# Patient Record
Sex: Female | Born: 1970 | Race: Black or African American | Hispanic: No | Marital: Single | Smoking: Never smoker
Health system: Southern US, Community
[De-identification: ages and names within clinical notes are randomized; demographics above are authoritative.]

## PROBLEM LIST (undated history)

## (undated) DIAGNOSIS — T7840XA Allergy, unspecified, initial encounter: Secondary | ICD-10-CM

## (undated) DIAGNOSIS — K219 Gastro-esophageal reflux disease without esophagitis: Secondary | ICD-10-CM

## (undated) DIAGNOSIS — M8700 Idiopathic aseptic necrosis of unspecified bone: Secondary | ICD-10-CM

## (undated) HISTORY — DX: Allergy, unspecified, initial encounter: T78.40XA

## (undated) HISTORY — DX: Idiopathic aseptic necrosis of unspecified bone: M87.00

## (undated) HISTORY — PX: UPPER GASTROINTESTINAL ENDOSCOPY: SHX188

---

## 2003-03-16 HISTORY — PX: TUBAL LIGATION: SHX77

## 2015-04-29 DIAGNOSIS — G8929 Other chronic pain: Secondary | ICD-10-CM | POA: Diagnosis not present

## 2015-04-29 DIAGNOSIS — M5441 Lumbago with sciatica, right side: Secondary | ICD-10-CM | POA: Diagnosis not present

## 2015-04-29 MED FILL — OMEPRAZOLE DR 20 MG CAPSULE: 20 | 90 days supply | Qty: 90 | Fill #0

## 2015-04-30 MED FILL — AMOXICILLIN 875 MG TABLET: 875 | 7 days supply | Qty: 14 | Fill #0

## 2015-05-15 DIAGNOSIS — L219 Seborrheic dermatitis, unspecified: Secondary | ICD-10-CM | POA: Diagnosis not present

## 2015-05-15 DIAGNOSIS — L700 Acne vulgaris: Secondary | ICD-10-CM | POA: Diagnosis not present

## 2015-05-15 MED FILL — KETOCONAZOLE 2% CREAM: 2 | 10 days supply | Qty: 30 | Fill #0

## 2015-05-15 MED FILL — FINACEA 15% FOAM: 15 | 30 days supply | Qty: 50 | Fill #0

## 2015-07-31 MED FILL — OMEPRAZOLE DR 20 MG CAPSULE: 20 | 90 days supply | Qty: 90 | Fill #1

## 2015-10-06 DIAGNOSIS — R922 Inconclusive mammogram: Secondary | ICD-10-CM | POA: Diagnosis not present

## 2015-10-06 DIAGNOSIS — Z1231 Encounter for screening mammogram for malignant neoplasm of breast: Secondary | ICD-10-CM | POA: Diagnosis not present

## 2015-10-09 DIAGNOSIS — N6489 Other specified disorders of breast: Secondary | ICD-10-CM | POA: Diagnosis not present

## 2015-10-09 DIAGNOSIS — R928 Other abnormal and inconclusive findings on diagnostic imaging of breast: Secondary | ICD-10-CM | POA: Diagnosis not present

## 2015-10-28 MED FILL — OMEPRAZOLE DR 20 MG CAPSULE: 20 | 90 days supply | Qty: 90 | Fill #2

## 2015-12-22 DIAGNOSIS — Z01419 Encounter for gynecological examination (general) (routine) without abnormal findings: Secondary | ICD-10-CM | POA: Diagnosis not present

## 2015-12-26 DIAGNOSIS — Z01419 Encounter for gynecological examination (general) (routine) without abnormal findings: Secondary | ICD-10-CM | POA: Diagnosis not present

## 2015-12-26 DIAGNOSIS — Z6827 Body mass index (BMI) 27.0-27.9, adult: Secondary | ICD-10-CM | POA: Diagnosis not present

## 2016-01-28 MED FILL — OMEPRAZOLE DR 20 MG CAPSULE: 20 | 90 days supply | Qty: 90 | Fill #0

## 2016-03-16 MED FILL — FLUCONAZOLE 150 MG TABLET: 150 | 1 days supply | Qty: 1 | Fill #0

## 2016-04-26 DIAGNOSIS — Z5181 Encounter for therapeutic drug level monitoring: Secondary | ICD-10-CM | POA: Diagnosis not present

## 2016-04-26 DIAGNOSIS — Z79899 Other long term (current) drug therapy: Secondary | ICD-10-CM | POA: Diagnosis not present

## 2016-04-26 DIAGNOSIS — K219 Gastro-esophageal reflux disease without esophagitis: Secondary | ICD-10-CM | POA: Diagnosis not present

## 2016-04-26 MED FILL — OMEPRAZOLE DR 20 MG CAPSULE: 20 | 90 days supply | Qty: 90 | Fill #0

## 2016-05-12 MED FILL — FLUCONAZOLE 150 MG TABLET: 150 | 1 days supply | Qty: 1 | Fill #1

## 2016-06-21 DIAGNOSIS — R928 Other abnormal and inconclusive findings on diagnostic imaging of breast: Secondary | ICD-10-CM | POA: Diagnosis not present

## 2016-06-21 DIAGNOSIS — R922 Inconclusive mammogram: Secondary | ICD-10-CM | POA: Diagnosis not present

## 2016-07-26 MED FILL — OMEPRAZOLE DR 20 MG CAPSULE: 20 | 90 days supply | Qty: 90 | Fill #1

## 2016-09-13 MED FILL — FLUCONAZOLE 150 MG TABLET: 150 | 1 days supply | Qty: 1 | Fill #2

## 2016-10-20 MED FILL — OMEPRAZOLE DR 20 MG CAPSULE: 20 | 90 days supply | Qty: 90 | Fill #2

## 2016-10-22 MED FILL — FLUCONAZOLE 150 MG TABLET: 150 | 1 days supply | Qty: 1 | Fill #3

## 2016-12-16 MED FILL — FLUCONAZOLE 150 MG TABLET: 150 | 1 days supply | Qty: 1 | Fill #4

## 2017-01-18 MED FILL — OMEPRAZOLE 20 MG CAP: 20 | 90 days supply | Qty: 90 | Fill #3

## 2017-02-16 DIAGNOSIS — Z01419 Encounter for gynecological examination (general) (routine) without abnormal findings: Secondary | ICD-10-CM | POA: Diagnosis not present

## 2017-02-16 DIAGNOSIS — Z1211 Encounter for screening for malignant neoplasm of colon: Secondary | ICD-10-CM | POA: Diagnosis not present

## 2017-02-16 DIAGNOSIS — R7309 Other abnormal glucose: Secondary | ICD-10-CM | POA: Diagnosis not present

## 2017-03-11 MED FILL — FLUCONAZOLE 150 MG TABLET: 150 | 3 days supply | Qty: 2 | Fill #0

## 2017-03-26 ENCOUNTER — Emergency Department (HOSPITAL_BASED_OUTPATIENT_CLINIC_OR_DEPARTMENT_OTHER)
Admission: EM | Admit: 2017-03-26 | Discharge: 2017-03-26 | Disposition: A | Discharge status: Home / Self Care | Payer: 59 | Attending: Emergency Medicine | Admitting: Emergency Medicine

## 2017-03-26 ENCOUNTER — Other Ambulatory Visit: Payer: Self-pay

## 2017-03-26 ENCOUNTER — Encounter (HOSPITAL_BASED_OUTPATIENT_CLINIC_OR_DEPARTMENT_OTHER): Payer: Self-pay | Admitting: Emergency Medicine

## 2017-03-26 DIAGNOSIS — H9203 Otalgia, bilateral: Secondary | ICD-10-CM

## 2017-03-26 DIAGNOSIS — H6992 Unspecified Eustachian tube disorder, left ear: Secondary | ICD-10-CM | POA: Diagnosis not present

## 2017-03-26 DIAGNOSIS — Z79899 Other long term (current) drug therapy: Secondary | ICD-10-CM | POA: Diagnosis not present

## 2017-03-26 DIAGNOSIS — R6883 Chills (without fever): Secondary | ICD-10-CM | POA: Diagnosis not present

## 2017-03-26 DIAGNOSIS — H6983 Other specified disorders of Eustachian tube, bilateral: Secondary | ICD-10-CM | POA: Insufficient documentation

## 2017-03-26 DIAGNOSIS — R0981 Nasal congestion: Secondary | ICD-10-CM | POA: Diagnosis not present

## 2017-03-26 DIAGNOSIS — H6991 Unspecified Eustachian tube disorder, right ear: Secondary | ICD-10-CM | POA: Diagnosis not present

## 2017-03-26 DIAGNOSIS — H9202 Otalgia, left ear: Secondary | ICD-10-CM | POA: Diagnosis not present

## 2017-03-26 DIAGNOSIS — H9201 Otalgia, right ear: Secondary | ICD-10-CM | POA: Diagnosis not present

## 2017-03-26 HISTORY — DX: Gastro-esophageal reflux disease without esophagitis: K21.9

## 2017-03-26 MED ORDER — PSEUDOEPHEDRINE HCL 60 MG PO TABS: 60.0000 mg | ORAL_TABLET | ORAL | 0 refills | Status: DC | PRN

## 2017-03-26 MED ORDER — FLUTICASONE PROPIONATE 50 MCG/ACT NA SUSP: 2.0000 | Freq: Every day | NASAL | 0 refills | Status: DC

## 2017-03-26 NOTE — ED Provider Notes (Signed)
[facility] EMERGENCY DEPARTMENT Provider Note   CSN: [id number] Arrival date & time: 03/26/17  1301     History   Chief Complaint Chief Complaint  Patient presents with  . Otalgia    HPI Janet May is a 47 y.o. female.  Patient with recent history of URI symptoms, now improved, presents with bilateral ear pressure continuing.  She has had chills but no documented fevers.  No facial pain but has had some mild congestion.  She is using over-the-counter cough and cold medications without relief.  Pain in the left ear is worse than the right.  No vomiting, cough or chest pain.  Onset of symptoms acute.  Course is constant.       Past Medical History:  Diagnosis Date  . GERD (gastroesophageal reflux disease)     There are no active problems to display for this patient.   History reviewed. No pertinent surgical history.  OB History    No data available       Home Medications    Prior to Admission medications   Medication Sig Start Date End Date Taking? Authorizing Provider  omeprazole (PRILOSEC) 20 MG capsule Take 20 mg by mouth daily.   Yes [provider]  fluticasone (FLONASE) 50 MCG/ACT nasal spray Place 2 sprays into both nostrils daily. 03/26/17   Carlisle Cater, PA-C  pseudoephedrine (SUDAFED) 60 MG tablet Take 1 tablet (60 mg total) by mouth every 4 (four) hours as needed for congestion. 03/26/17   Carlisle Cater, PA-C    Family History History reviewed. No pertinent family history.  Social History Social History   Tobacco Use  . Smoking status: Never Smoker  . Smokeless tobacco: Never Used  Substance Use Topics  . Alcohol use: No    Frequency: Never  . Drug use: No     Allergies   Sulfa antibiotics   Review of Systems Review of Systems  Constitutional: Positive for chills. Negative for fatigue and fever.  HENT: Positive for congestion and ear pain. Negative for rhinorrhea, sinus pressure and sore throat.   Eyes:  Negative for redness.  Respiratory: Negative for cough and wheezing.   Gastrointestinal: Negative for abdominal pain, diarrhea, nausea and vomiting.  Genitourinary: Negative for dysuria.  Musculoskeletal: Negative for myalgias and neck stiffness.  Skin: Negative for rash.  Neurological: Negative for headaches.  Hematological: Negative for adenopathy.     Physical Exam Updated Vital Signs BP (!) 145/90 (BP Location: Left Arm)   Pulse 76   Temp 98.2 F (36.8 C) (Oral)   Resp 18   Ht 5\' 5"  (1.651 m)   Wt 74.8 kg (165 lb)   LMP 03/12/2017   SpO2 100%   BMI 27.46 kg/m   Physical Exam  Constitutional: She appears well-developed and well-nourished.  HENT:  Head: Normocephalic and atraumatic.  Right Ear: External ear and ear canal normal. Tympanic membrane is not retracted and not bulging.  Left Ear: External ear and ear canal normal. Tympanic membrane is bulging. Tympanic membrane is not retracted.  Nose: Nose normal. No mucosal edema or rhinorrhea.  Mouth/Throat: Uvula is midline, oropharynx is clear and moist and mucous membranes are normal. Mucous membranes are not dry. No oral lesions. No trismus in the jaw. No uvula swelling. No oropharyngeal exudate, posterior oropharyngeal edema, posterior oropharyngeal erythema or tonsillar abscesses.  Eyes: Conjunctivae are normal. Right eye exhibits no discharge. Left eye exhibits no discharge.  Neck: Normal range of motion. Neck supple.  Cardiovascular: Normal rate,  regular rhythm and normal heart sounds.  Pulmonary/Chest: Effort normal and breath sounds normal. No respiratory distress. She has no wheezes. She has no rales.  Abdominal: Soft. There is no tenderness.  Lymphadenopathy:    She has no cervical adenopathy.  Neurological: She is alert.  Skin: Skin is warm and dry.  Psychiatric: She has a normal mood and affect.  Nursing note and vitals reviewed.    ED Treatments / Results   Procedures Procedures (including critical care  time)  Medications Ordered in ED Medications - No data to display   Initial Impression / Assessment and Plan / ED Course  I have reviewed the triage vital signs and the nursing notes.  Pertinent labs & imaging results that were available during my care of the patient were reviewed by me and considered in my medical decision making (see chart for details).     Patient seen and examined.  Discussed conservative measures for eustachian tube dysfunction and expected course.  Vital signs reviewed and are as follows: BP (!) 145/90 (BP Location: Left Arm)   Pulse 76   Temp 98.2 F (36.8 C) (Oral)   Resp 18   Ht 5\' 5"  (1.651 m)   Wt 74.8 kg (165 lb)   LMP 03/12/2017   SpO2 100%   BMI 27.46 kg/m   Encouraged follow-up PCP if not improved in 1 week.  Patient urged to return with worsening symptoms or other concerns. Patient verbalized understanding and agrees with plan.    Final Clinical Impressions(s) / ED Diagnoses   Final diagnoses:  Dysfunction of both eustachian tubes  Otalgia of both ears   Bilateral ear pain, left greater than right, after recent UTI.  Patient does have some bulging without infection of her left eardrum.  Symptoms and history consistent with eustachian tube dysfunction.  Treatment as above.  Discussed expectant management.  ED Discharge Orders        Ordered    pseudoephedrine (SUDAFED) 60 MG tablet  Every 4 hours PRN     03/26/17 1456    fluticasone (FLONASE) 50 MCG/ACT nasal spray  Daily     03/26/17 1456       Carlisle Cater, PA-C 03/26/17 1507    Drenda Freeze, MD 03/26/17 [phone]

## 2017-03-26 NOTE — ED Triage Notes (Signed)
Patient states that she has had cough, and congestion since New years. The patient reports that now both ears hurt L > R

## 2017-03-26 NOTE — Discharge Instructions (Signed)
Please read and follow all provided instructions.  Your diagnoses today include:  1. Dysfunction of both eustachian tubes   2. Otalgia of both ears    Tests performed today include:  Vital signs. See below for your results today.   Medications prescribed:   Pseudoephedrine - decongestant medication to help with nasal congestion   Flonase - nasal spray steroid  Take any prescribed medications only as directed. Treatment for your infection is aimed at treating the symptoms. There are no medications, such as antibiotics, that will cure your infection.   Home care instructions:  Follow any educational materials contained in this packet.   Please continue drinking plenty of fluids.  Use over-the-counter medicines as needed as directed on packaging for symptom relief.  You may also use ibuprofen or tylenol as directed on packaging for pain or fever.  Do not take multiple medicines containing Tylenol or acetaminophen to avoid taking too much of this medication.  Follow-up instructions: Please follow-up with your primary care provider in the next 7 days for further evaluation of your symptoms if you are not feeling better.   Return instructions:   Please return to the Emergency Department if you experience worsening symptoms.   RETURN IMMEDIATELY IF you develop shortness of breath, confusion or altered mental status, a new rash, become dizzy, faint, or poorly responsive, or are unable to be cared for at home.  Please return if you have persistent vomiting and cannot keep down fluids or develop a fever that is not controlled by tylenol or motrin.    Please return if you have any other emergent concerns.  Additional Information:  Your vital signs today were: BP (!) 145/90 (BP Location: Left Arm)    Pulse 76    Temp 98.2 F (36.8 C) (Oral)    Resp 18    Ht 5\' 5"  (1.651 m)    Wt 74.8 kg (165 lb)    LMP 03/12/2017    SpO2 100%    BMI 27.46 kg/m  If your blood pressure (BP) was elevated  above 135/85 this visit, please have this repeated by your doctor within one month. --------------

## 2017-04-12 DIAGNOSIS — Z1231 Encounter for screening mammogram for malignant neoplasm of breast: Secondary | ICD-10-CM | POA: Diagnosis not present

## 2017-04-19 MED FILL — OMEPRAZOLE 20 MG CAP: 20 | 90 days supply | Qty: 90 | Fill #0

## 2017-04-26 MED FILL — FLUCONAZOLE 150 MG TABS: 150 | 3 days supply | Qty: 2 | Fill #1

## 2017-05-20 DIAGNOSIS — Z79899 Other long term (current) drug therapy: Secondary | ICD-10-CM | POA: Diagnosis not present

## 2017-05-20 DIAGNOSIS — K219 Gastro-esophageal reflux disease without esophagitis: Secondary | ICD-10-CM | POA: Diagnosis not present

## 2017-05-20 DIAGNOSIS — Z5181 Encounter for therapeutic drug level monitoring: Secondary | ICD-10-CM | POA: Diagnosis not present

## 2017-06-17 MED FILL — FLUCONAZOLE 150 MG TABS: 150 | 3 days supply | Qty: 2 | Fill #2

## 2017-07-08 MED FILL — OMEPRAZOLE 20 MG CAP: 20 | 90 days supply | Qty: 90 | Fill #0

## 2017-08-29 MED FILL — FLUCONAZOLE 150 MG TABS: 150 | 3 days supply | Qty: 2 | Fill #3

## 2017-09-28 DIAGNOSIS — M25551 Pain in right hip: Secondary | ICD-10-CM | POA: Diagnosis not present

## 2017-09-28 DIAGNOSIS — G8929 Other chronic pain: Secondary | ICD-10-CM | POA: Diagnosis not present

## 2017-10-04 MED FILL — OMEPRAZOLE 20 MG CPDR: 20 | 90 days supply | Qty: 90 | Fill #1

## 2018-01-02 MED FILL — OMEPRAZOLE 20 MG CPDR: 20 | 90 days supply | Qty: 90 | Fill #2

## 2018-01-18 ENCOUNTER — Ambulatory Visit (INDEPENDENT_AMBULATORY_CARE_PROVIDER_SITE_OTHER): Payer: 59

## 2018-01-18 ENCOUNTER — Encounter (INDEPENDENT_AMBULATORY_CARE_PROVIDER_SITE_OTHER): Payer: Self-pay | Admitting: Surgery

## 2018-01-18 ENCOUNTER — Ambulatory Visit (INDEPENDENT_AMBULATORY_CARE_PROVIDER_SITE_OTHER): Payer: 59 | Admitting: Surgery

## 2018-01-18 DIAGNOSIS — M25551 Pain in right hip: Secondary | ICD-10-CM | POA: Diagnosis not present
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
## 2018-01-19 ENCOUNTER — Other Ambulatory Visit (INDEPENDENT_AMBULATORY_CARE_PROVIDER_SITE_OTHER): Payer: Self-pay | Admitting: Surgery

## 2018-01-19 ENCOUNTER — Telehealth (INDEPENDENT_AMBULATORY_CARE_PROVIDER_SITE_OTHER): Payer: Self-pay

## 2018-01-19 ENCOUNTER — Ambulatory Visit (INDEPENDENT_AMBULATORY_CARE_PROVIDER_SITE_OTHER): Payer: 59

## 2018-01-19 DIAGNOSIS — M25551 Pain in right hip: Secondary | ICD-10-CM | POA: Diagnosis not present

## 2018-01-19 NOTE — Telephone Encounter (Signed)
Patient unable to get labs drawn at [facility], advised patient to go to [place] location to have labs drawn at the lab there.

## 2018-01-19 NOTE — Addendum Note (Signed)
Addended by: Brand Males E on: 01/19/2018 10:43 AM   Modules accepted: Orders

## 2018-01-19 NOTE — Addendum Note (Signed)
Addended byLaurann Montana on: 01/19/2018 10:47 AM   Modules accepted: Orders

## 2018-01-21 LAB — CBC WITH DIFFERENTIAL/PLATELET
BASOS ABS: 12 {cells}/uL (ref 0–200)
Basophils Relative: 0.2 %
Eosinophils Absolute: 19 cells/uL (ref 15–500)
Eosinophils Relative: 0.3 %
HEMATOCRIT: 43 % (ref 35.0–45.0)
HEMOGLOBIN: 14.2 g/dL (ref 11.7–15.5)
LYMPHS ABS: 1823 {cells}/uL (ref 850–3900)
MCH: 29.5 pg (ref 27.0–33.0)
MCHC: 33 g/dL (ref 32.0–36.0)
MCV: 89.4 fL (ref 80.0–100.0)
MPV: 10.6 fL (ref 7.5–12.5)
Monocytes Relative: 8.2 %
NEUTROS PCT: 61.9 %
Neutro Abs: 3838 cells/uL (ref 1500–7800)
Platelets: 336 10*3/uL (ref 140–400)
RBC: 4.81 10*6/uL (ref 3.80–5.10)
RDW: 12.4 % (ref 11.0–15.0)
Total Lymphocyte: 29.4 %
WBC: 6.2 10*3/uL (ref 3.8–10.8)
WBCMIX: 508 {cells}/uL (ref 200–950)

## 2018-01-21 LAB — ANA: Anti Nuclear Antibody(ANA): NEGATIVE

## 2018-01-21 LAB — URIC ACID: URIC ACID, SERUM: 6.4 mg/dL (ref 2.5–7.0)

## 2018-01-21 LAB — RHEUMATOID FACTOR: Rhuematoid fact SerPl-aCnc: <14 IU/mL (ref <14)

## 2018-01-21 LAB — SEDIMENTATION RATE: Sed Rate: 17 mm/h (ref 0–20)

## 2018-01-27 ENCOUNTER — Ambulatory Visit
Admission: RE | Admit: 2018-01-27 | Discharge: 2018-01-27 | Disposition: A | Discharge status: Home / Self Care | Payer: 59 | Source: Ambulatory Visit | Attending: Surgery | Admitting: Surgery

## 2018-01-27 DIAGNOSIS — M25551 Pain in right hip: Secondary | ICD-10-CM

## 2018-01-27 DIAGNOSIS — M87051 Idiopathic aseptic necrosis of right femur: Secondary | ICD-10-CM | POA: Diagnosis not present

## 2018-02-07 ENCOUNTER — Ambulatory Visit (INDEPENDENT_AMBULATORY_CARE_PROVIDER_SITE_OTHER): Payer: 59 | Admitting: Orthopaedic Surgery

## 2018-02-08 ENCOUNTER — Encounter (INDEPENDENT_AMBULATORY_CARE_PROVIDER_SITE_OTHER): Payer: Self-pay | Admitting: Surgery

## 2018-02-08 ENCOUNTER — Ambulatory Visit (INDEPENDENT_AMBULATORY_CARE_PROVIDER_SITE_OTHER): Payer: 59 | Admitting: Surgery

## 2018-02-08 DIAGNOSIS — M87051 Idiopathic aseptic necrosis of right femur: Secondary | ICD-10-CM

## 2018-02-08 NOTE — Progress Notes (Signed)
Office Visit Note   Patient: Janet May           Date of Birth: 09-17-1970           MRN: [id number] Visit Date: 02/08/2018              Requested by: No referring provider defined for this encounter. PCP: Patient, No Pcp Per   Assessment & Plan: Visit Diagnoses:  1. Avascular necrosis of bone of hip, right ([place])     Plan: Reviewed MRI report with Dr. Jean Rosenthal this morning.  He would like to see patient in 2 weeks for recheck.  Patient given a prescription for a single prong cane and Dr. Ninfa Linden recommended that she use this to help offload the hip to see how her symptoms are.  He does not recommend intra-articular hip injection.  I also did review MRI with patient.  Advised her that ultimately may come down her needing definitive treatment with total hip replacement.  Total hip replacement pamphlet given.  Follow-Up Instructions: Return in about 2 weeks (around 02/22/2018) for with Dr Ninfa Linden (per Cartier Washko/blackman).   Orders:  No orders of the defined types were placed in this encounter.  No orders of the defined types were placed in this encounter.     Procedures: No procedures performed   Clinical Data: No additional findings.   Subjective: Chief Complaint  Patient presents with  . Right Hip - Follow-up    HPI 47 year old black female history of right hip pain returns for review of right hip MRI and labs.  CBC and arthritis panel performed last office visit was unremarkable.  Right hip MRI from January 27, 2018 showed  EXAM: MR OF THE RIGHT HIP WITHOUT CONTRAST  TECHNIQUE: Multiplanar, multisequence MR imaging was performed. No intravenous contrast was administered.  COMPARISON:  Right hip x-rays dated January 18, 2018.  FINDINGS: Bones: Signal changes in the right femoral head consistent with avascular necrosis. No subchondral collapse. Reactive marrow edema extends into the right femoral neck. No acute fracture or dislocation.  No focal bone lesion. The visualized sacroiliac joints and symphysis pubis appear normal.  Articular cartilage and labrum  Articular cartilage: No focal chondral defect or subchondral signal abnormality identified.  Labrum: There is no gross labral tear or paralabral abnormality.  Joint or bursal effusion  Joint effusion: Small right hip joint effusion.  Bursae: No focal periarticular fluid collection.  Muscles and tendons  Muscles and tendons: The visualized gluteus, hamstring and iliopsoas tendons appear normal. No muscle edema or atrophy.  Other findings  Miscellaneous: The visualized internal pelvic contents appear unremarkable.  IMPRESSION: 1. Right femoral head avascular necrosis without subchondral collapse.  Continues have pain in the right hip.  Has been taking oral NSAID with minimal improvement.  Review of Systems No current cardiac pulmonary GI GU issues  Objective: Vital Signs: There were no vitals taken for this visit.  Physical Exam Very pleasant white female alert and oriented in no acute distress.  Gait is somewhat antalgic.Manson Passey Exam  Specialty Comments:  No specialty comments available.  Imaging: No results found.   PMFS History: There are no active problems to display for this patient.  Past Medical History:  Diagnosis Date  . GERD (gastroesophageal reflux disease)     History reviewed. No pertinent family history.  History reviewed. No pertinent surgical history. Social History   Occupational History  . Not on file  Tobacco Use  . Smoking status: Never Smoker  .  Smokeless tobacco: Never Used  Substance and Sexual Activity  . Alcohol use: No    Frequency: Never  . Drug use: No  . Sexual activity: Not on file

## 2018-02-22 ENCOUNTER — Ambulatory Visit (INDEPENDENT_AMBULATORY_CARE_PROVIDER_SITE_OTHER): Payer: 59 | Admitting: Orthopaedic Surgery

## 2018-02-22 ENCOUNTER — Encounter (INDEPENDENT_AMBULATORY_CARE_PROVIDER_SITE_OTHER): Payer: Self-pay | Admitting: Orthopaedic Surgery

## 2018-02-22 DIAGNOSIS — M25551 Pain in right hip: Secondary | ICD-10-CM

## 2018-02-22 DIAGNOSIS — M87051 Idiopathic aseptic necrosis of right femur: Secondary | ICD-10-CM

## 2018-02-22 NOTE — Progress Notes (Signed)
Office Visit Note   Patient: Janet May           Date of Birth: [birth year]           MRN: [id number] Visit Date: 02/22/2018              Requested by: No referring provider defined for this encounter. PCP: Patient, No Pcp Per   Assessment & Plan: Visit Diagnoses:  1. Avascular necrosis of bone of hip, right (HCC)   2. Pain in right hip     Plan: I talked at length with the patient about her x-ray findings and we went over them with her in detail.  I showed her hip model explained significant detail what hip replacement surgery means and what the treatment for avascular necrosis would be in her right hip.  She would continue using a cane to offload her right hip.  I talked about the surgery in detail including her intraoperative and postoperative course and what the risk and benefits of surgery are.  I gave her handout explaining this as well.  She is going to think about this.  I told her she does not need to rush into this but needs to be careful avoiding all high impact aerobic activities as it relates to her right hip.  If things acutely worsen she will let us know.  She does have her surgery schedulers card to consider hip replacement surgery which we have recommended for her.  Question concerns were answered and addressed.  She will let us know.  Follow-Up Instructions: Return if symptoms worsen or fail to improve.   Orders:  No orders of the defined types were placed in this encounter.  No orders of the defined types were placed in this encounter.     Procedures: No procedures performed   Clinical Data: No additional findings.   Subjective: Chief Complaint  Patient presents with  . Right Hip - Follow-up  The patient is a very pleasant 47 year old female with known avascular necrosis of her left hip of uncertain etiology.  She works for the [employer] and mainly works from home.  There is a family history of arthritis and her father has had a hip replacement.   This came on more abruptly over the last few months with no known injury.  She does not drink alcohol.  She has no history of sickle cell disease.  She has had some steroid injections in the past in her lumbar spine area in her backside at urgent care when she is having back pain.  She denies any severe injury to her right hip.  She has no left hip pain at all.  She has no knee pain or back issues.  She has been using a cane to offload her hip.  There is plain films and MRI on the canopy system for me to review.  She does report right hip and groin pain.  HPI  Review of Systems She currently denies any headache, chest pain, shortness of breath, fever, chills, nausea, vomiting.  Objective: Vital Signs: There were no vitals taken for this visit.  Physical Exam She is alert and orient x3 and in no acute distress Ortho Exam Examination of her right hip shows fluid and full range of motion with no blocks rotation.  There is pain with compression of the hip joint and pain on the extreme of internal and external rotation of the right hip. Specialty Comments:  No specialty comments available.  Imaging:  No results found. Plain films of her pelvis and right hip as well as an MRI of her right hip shows findings consistent with avascular necrosis.  There is no subchondral collapse as of yet but there is a joint effusion and cystic changes in the femoral head.  There is edema extending into the femoral neck.  PMFS History: Patient Active Problem List   Diagnosis Date Noted  . Avascular necrosis of bone of hip, right ([place]) 02/22/2018   Past Medical History:  Diagnosis Date  . GERD (gastroesophageal reflux disease)     History reviewed. No pertinent family history.  History reviewed. No pertinent surgical history. Social History   Occupational History  . Not on file  Tobacco Use  . Smoking status: Never Smoker  . Smokeless tobacco: Never Used  Substance and Sexual Activity  . Alcohol use: No     Frequency: Never  . Drug use: No  . Sexual activity: Not on file

## 2018-03-30 MED FILL — OMEPRAZOLE 20 MG CPDR: 20 | 90 days supply | Qty: 90 | Fill #3

## 2018-04-17 DIAGNOSIS — Z124 Encounter for screening for malignant neoplasm of cervix: Secondary | ICD-10-CM | POA: Diagnosis not present

## 2018-04-17 DIAGNOSIS — B379 Candidiasis, unspecified: Secondary | ICD-10-CM | POA: Diagnosis not present

## 2018-04-17 DIAGNOSIS — M87051 Idiopathic aseptic necrosis of right femur: Secondary | ICD-10-CM | POA: Diagnosis not present

## 2018-04-17 DIAGNOSIS — Z01419 Encounter for gynecological examination (general) (routine) without abnormal findings: Secondary | ICD-10-CM | POA: Diagnosis not present

## 2018-04-17 MED FILL — FLUCONAZOLE 150 MG TABS: 150 | 1 days supply | Qty: 1 | Fill #0

## 2018-04-20 ENCOUNTER — Telehealth (INDEPENDENT_AMBULATORY_CARE_PROVIDER_SITE_OTHER): Payer: Self-pay | Admitting: Orthopaedic Surgery

## 2018-04-20 DIAGNOSIS — M87051 Idiopathic aseptic necrosis of right femur: Secondary | ICD-10-CM

## 2018-04-20 NOTE — Telephone Encounter (Signed)
Patient sent a mychart message wanting to get another MRI of her hip and if it shows the same, she would like to move forward with hip replacement surgery.  [phone].  Thank you.

## 2018-04-20 NOTE — Telephone Encounter (Signed)
See below

## 2018-04-20 NOTE — Telephone Encounter (Signed)
Please order an MRI of her right hip to assess for progression of avascular necrosis of the right hip and evaluate for femoral head collapse.

## 2018-04-21 DIAGNOSIS — Z1231 Encounter for screening mammogram for malignant neoplasm of breast: Secondary | ICD-10-CM | POA: Diagnosis not present

## 2018-04-21 DIAGNOSIS — Z1239 Encounter for other screening for malignant neoplasm of breast: Secondary | ICD-10-CM | POA: Diagnosis not present

## 2018-04-21 NOTE — Telephone Encounter (Signed)
Order entered. Patient advised. She will call to schedule follow up appt for MRI review once MRI is scheduled.

## 2018-05-03 DIAGNOSIS — R922 Inconclusive mammogram: Secondary | ICD-10-CM | POA: Diagnosis not present

## 2018-05-03 DIAGNOSIS — N6489 Other specified disorders of breast: Secondary | ICD-10-CM | POA: Diagnosis not present

## 2018-05-03 DIAGNOSIS — R928 Other abnormal and inconclusive findings on diagnostic imaging of breast: Secondary | ICD-10-CM | POA: Diagnosis not present

## 2018-05-08 ENCOUNTER — Ambulatory Visit
Admission: RE | Admit: 2018-05-08 | Discharge: 2018-05-08 | Disposition: A | Discharge status: Home / Self Care | Payer: 59 | Source: Ambulatory Visit | Attending: Orthopaedic Surgery | Admitting: Orthopaedic Surgery

## 2018-05-08 DIAGNOSIS — M87051 Idiopathic aseptic necrosis of right femur: Secondary | ICD-10-CM

## 2018-05-08 DIAGNOSIS — M87851 Other osteonecrosis, right femur: Secondary | ICD-10-CM | POA: Diagnosis not present

## 2018-06-22 MED FILL — FLUCONAZOLE 150 MG TABS: 150 | 1 days supply | Qty: 1 | Fill #1

## 2018-06-22 MED FILL — OMEPRAZOLE 20 MG CPDR: 20 | 90 days supply | Qty: 90 | Fill #0

## 2018-07-05 DIAGNOSIS — Z1211 Encounter for screening for malignant neoplasm of colon: Secondary | ICD-10-CM | POA: Diagnosis not present

## 2018-07-05 DIAGNOSIS — K219 Gastro-esophageal reflux disease without esophagitis: Secondary | ICD-10-CM | POA: Diagnosis not present

## 2018-07-05 DIAGNOSIS — Z8371 Family history of colonic polyps: Secondary | ICD-10-CM | POA: Diagnosis not present

## 2018-07-05 MED FILL — OMEPRAZOLE 40 MG CPDR: 40 | 90 days supply | Qty: 90 | Fill #0

## 2018-10-06 MED FILL — OMEPRAZOLE 40 MG CPDR: 40 | 90 days supply | Qty: 90 | Fill #1

## 2018-11-21 ENCOUNTER — Ambulatory Visit: Payer: 59 | Admitting: Family Medicine

## 2018-11-21 ENCOUNTER — Other Ambulatory Visit: Payer: Self-pay

## 2018-11-21 ENCOUNTER — Encounter: Payer: Self-pay | Admitting: Family Medicine

## 2018-11-21 VITALS — BP 118/84 | HR 88 | Temp 97.5°F | Ht 65.0 in | Wt 175.1 lb

## 2018-11-21 DIAGNOSIS — Z0000 Encounter for general adult medical examination without abnormal findings: Secondary | ICD-10-CM | POA: Diagnosis not present

## 2018-11-21 DIAGNOSIS — Z23 Encounter for immunization: Secondary | ICD-10-CM | POA: Diagnosis not present

## 2018-11-21 MED ORDER — FLUTICASONE PROPIONATE 50 MCG/ACT NA SUSP: 2.0000 | Freq: Every day | NASAL | 5 refills | Status: DC

## 2018-11-21 MED ORDER — OMEPRAZOLE 20 MG PO CPDR: 20.0000 mg | DELAYED_RELEASE_CAPSULE | Freq: Every day | ORAL | 5 refills | Status: DC

## 2018-11-21 MED FILL — FLUTICASONE PROP 50 MCG SPR: 50 | 30 days supply | Qty: 16 | Fill #0

## 2018-11-21 MED FILL — OMEPRAZOLE 20 MG CAP: 20 | 30 days supply | Qty: 30 | Fill #0

## 2018-11-21 NOTE — Progress Notes (Signed)
Chief Complaint  Patient presents with  . New Patient (Initial Visit)     Well Woman Janet May is here for a complete physical.   Her last physical was >1 year ago.  Current diet: in general, diet could be better. Current exercise: little. Weight is increasing steadily and she denies daytime fatigue. No LMP recorded.  Seatbelt? Yes  Health Maintenance Pap/HPV- Yes Mammogram- Yes Tetanus- No HIV screening- Yes 17 yrs ago  Past Medical History:  Diagnosis Date  . AVN (avascular necrosis of bone) (HCC)    right hip  . GERD (gastroesophageal reflux disease)      Past Surgical History:  Procedure Laterality Date  . CESAREAN SECTION  2001  . CESAREAN SECTION  2002  . TUBAL LIGATION  2005    Medications  Current Outpatient Medications on File Prior to Visit  Medication Sig Dispense Refill  . ibuprofen (ADVIL,MOTRIN) 400 MG tablet Take 400 mg by mouth every 6 (six) hours as needed.    . pseudoephedrine (SUDAFED) 60 MG tablet Take 1 tablet (60 mg total) by mouth every 4 (four) hours as needed for congestion. 30 tablet 0   Allergies Allergies  Allergen Reactions  . Sulfa Antibiotics Hives    Review of Systems: Constitutional:  no unexpected weight changes Eye:  no recent significant change in vision Ear/Nose/Mouth/Throat:  Ears:  no tinnitus or vertigo and no recent change in hearing Nose/Mouth/Throat:  no complaints of nasal congestion, no sore throat Cardiovascular: no chest pain Respiratory:  no cough and no shortness of breath Gastrointestinal:  no abdominal pain, no change in bowel habits GU:  Female: negative for dysuria or pelvic pain Musculoskeletal/Extremities:  no pain of the joints Integumentary (Skin/Breast): +skin tag under R arm; otherwise no abnormal skin lesions reported Neurologic:  no headaches Endocrine:  denies fatigue Hematologic/Lymphatic:  No areas of easy bleeding  Exam BP 118/84 (BP Location: Left Arm, Patient Position: Sitting,  Cuff Size: Normal)   Pulse 88   Temp (!) 97.5 F (36.4 C) (Temporal)   Ht 5\' 5"  (1.651 m)   Wt 175 lb 2 oz (79.4 kg)   SpO2 95%   BMI 29.14 kg/m  General:  well developed, well nourished, in no apparent distress Skin: skin tag distal to L axillae; otherwise no significant moles, warts, or growths Head:  no masses, lesions, or tenderness Eyes:  pupils equal and round, sclera anicteric without injection Ears:  canals without lesions, TMs shiny without retraction, no obvious effusion, no erythema Nose:  nares patent, septum midline, mucosa normal, and no drainage or sinus tenderness Throat/Pharynx:  lips and gingiva without lesion; tongue and uvula midline; non-inflamed pharynx; no exudates or postnasal drainage Neck: neck supple without adenopathy, thyromegaly, or masses Lungs:  clear to auscultation, breath sounds equal bilaterally, no respiratory distress Cardio:  regular rate and rhythm, no bruits, no LE edema Abdomen:  abdomen soft, nontender; bowel sounds normal; no masses or organomegaly Genital: Defer to GYN Musculoskeletal:  symmetrical muscle groups noted without atrophy or deformity Extremities:  no clubbing, cyanosis, or edema, no deformities, no skin discoloration Neuro:  gait normal; deep tendon reflexes normal and symmetric Psych: well oriented with normal range of affect and appropriate judgment/insight  Assessment and Plan  Well adult exam - Plan: CBC, Comprehensive metabolic panel, Hemoglobin A1c, Lipid panel  Need for tetanus booster - Plan: Tdap vaccine greater than or equal to 4yo IM   Well 48 y.o. female. Counseled on diet and exercise. Other orders as  above. Follow up in 1 yr or prn. The patient voiced understanding and agreement to the plan.  [place], DO 11/21/18 4:35 PM

## 2018-11-21 NOTE — Patient Instructions (Signed)
Give [place] 2-3 business days to get the results of your labs back.   Keep the diet clean and stay active.  Aim to do some physical exertion for 150 minutes per week. This is typically divided into 5 days per week, 30 minutes per day. The activity should be enough to get your heart rate up. Anything is better than nothing if you have time constraints. Consider yoga.  I recommend getting the flu shot in mid October. This suggestion would change if the CDC comes out with a different recommendation.   Let us know if you need anything.

## 2018-11-24 MED FILL — FLUCONAZOLE 150 MG TABS: 150 | 1 days supply | Qty: 1 | Fill #2

## 2019-01-17 MED FILL — FLUCONAZOLE 150 MG TABS: 150 | 1 days supply | Qty: 1 | Fill #3

## 2019-02-13 ENCOUNTER — Encounter: Payer: Self-pay | Admitting: Family Medicine

## 2019-02-13 ENCOUNTER — Ambulatory Visit (INDEPENDENT_AMBULATORY_CARE_PROVIDER_SITE_OTHER): Payer: 59 | Admitting: Family Medicine

## 2019-02-13 ENCOUNTER — Other Ambulatory Visit: Payer: Self-pay

## 2019-02-13 DIAGNOSIS — J029 Acute pharyngitis, unspecified: Secondary | ICD-10-CM | POA: Diagnosis not present

## 2019-02-13 DIAGNOSIS — R0981 Nasal congestion: Secondary | ICD-10-CM | POA: Diagnosis not present

## 2019-02-13 DIAGNOSIS — J0140 Acute pansinusitis, unspecified: Secondary | ICD-10-CM | POA: Diagnosis not present

## 2019-02-13 MED ORDER — AMOXICILLIN-POT CLAVULANATE 875-125 MG PO TABS: 1.0000 | ORAL_TABLET | Freq: Two times a day (BID) | ORAL | 0 refills | Status: DC

## 2019-02-13 MED ORDER — PREDNISONE 20 MG PO TABS: 40.0000 mg | ORAL_TABLET | Freq: Every day | ORAL | 0 refills | Status: AC

## 2019-02-13 MED ORDER — FLUCONAZOLE 150 MG PO TABS: 150.0000 mg | ORAL_TABLET | Freq: Once | ORAL | 0 refills | Status: AC

## 2019-02-13 MED FILL — FLUCONAZOLE 150 MG TABS: 150 | 1 days supply | Qty: 1 | Fill #0

## 2019-02-13 MED FILL — AMOX-CLAV 875-125 MG TABLET: 875-125 | 10 days supply | Qty: 20 | Fill #0

## 2019-02-13 MED FILL — predniSONE 20 MG TABS: 20 | 5 days supply | Qty: 10 | Fill #0

## 2019-02-13 MED FILL — OMEPRAZOLE 40 MG CPDR: 40 | 90 days supply | Qty: 90 | Fill #2

## 2019-02-13 NOTE — Progress Notes (Signed)
Chief Complaint  Patient presents with  . Sore Throat    feels better today  . Headache  . Fatigue    Janet May here for URI complaints. Due to COVID-19 pandemic, we are interacting via web portal for an electronic face-to-face visit. I verified patient's ID using 2 identifiers. Patient agreed to proceed with visit via this method. Patient is at home, I am at office. Patient and I are present for visit.   Duration: 6 days  Associated symptoms: sinus headache, sinus congestion, sinus pain, rhinorrhea, itchy watery eyes, ear fullness, ear pain, sore throat and nausea Denies: ear drainage, wheezing, shortness of breath, myalgia and fevers Treatment to date: Robitussin, Advil Sick contacts: No  ROS:  Const: Denies fevers HEENT: As noted in HPI Lungs: No SOB  Past Medical History:  Diagnosis Date  . AVN (avascular necrosis of bone) ([place])    right hip  . GERD (gastroesophageal reflux disease)    Exam No conversational dyspnea Age appropriate judgment and insight Nml affect and mood  Sore throat - Plan: Novel Coronavirus, NAA ([facility]), predniSONE (DELTASONE) 20 MG tablet  Nasal congestion - Plan: Novel Coronavirus, NAA ([facility]), predniSONE (DELTASONE) 20 MG tablet  Acute pansinusitis, recurrence not specified - Plan: amoxicillin-clavulanate (AUGMENTIN) 875-125 MG tablet, predniSONE (DELTASONE) 20 MG tablet, fluconazole (DIFLUCAN) 150 MG tablet  Ck Covid. Pred burst. If no improvement, pocket rx for abx + Diflucan for yeast.  Continue to push fluids, practice good hand hygiene, cover mouth when coughing. F/u prn. If starting to experience fevers, shaking, or shortness of breath, seek immediate care. Pt voiced understanding and agreement to the plan.  [place], DO 02/13/19 9:21 AM

## 2019-02-14 ENCOUNTER — Other Ambulatory Visit: Payer: Self-pay

## 2019-02-14 DIAGNOSIS — Z20822 Contact with and (suspected) exposure to covid-19: Secondary | ICD-10-CM

## 2019-02-16 LAB — NOVEL CORONAVIRUS, NAA: SARS-CoV-2, NAA: NOT DETECTED

## 2019-03-12 ENCOUNTER — Telehealth: Payer: Self-pay

## 2019-03-12 NOTE — Telephone Encounter (Signed)
Since it has been over a year since I have seen her, I will need to see her for a detailed office visit before scheduling her surgery to make sure there is been no changes.  Certainly we can schedule her in the near future, however she still needs to be seen first so I can even get an idea of whether or not she can be potentially a same-day surgery or not.  Likely not the same day.

## 2019-03-12 NOTE — Telephone Encounter (Signed)
Email from patient: I am a patient of Dr. Rush Farmer I was diagnosed with AVN November 2019. Dr. Rush Farmer advised me back then to contact the office when I'm ready to move forward with Hip Replacement surgery. I think I am ready.  My insurance is still with [employer] but I did change my plan for 2021. Please contact at any time to let me know what I need to do next. [phone]. Thanks  Schedule?  Can this be a same day discharge?

## 2019-03-13 NOTE — Telephone Encounter (Signed)
I called patient and scheduled office visit with Dr. Ninfa Linden.

## 2019-03-23 MED FILL — FLUTICASONE PROP 50 MCG SPR: 50 | 30 days supply | Qty: 16 | Fill #1

## 2019-03-26 ENCOUNTER — Ambulatory Visit (INDEPENDENT_AMBULATORY_CARE_PROVIDER_SITE_OTHER): Payer: 59 | Admitting: Orthopaedic Surgery

## 2019-03-26 ENCOUNTER — Ambulatory Visit (INDEPENDENT_AMBULATORY_CARE_PROVIDER_SITE_OTHER): Payer: 59

## 2019-03-26 ENCOUNTER — Other Ambulatory Visit: Payer: Self-pay

## 2019-03-26 VITALS — Ht 65.0 in | Wt 175.0 lb

## 2019-03-26 DIAGNOSIS — M25551 Pain in right hip: Secondary | ICD-10-CM

## 2019-03-26 DIAGNOSIS — M87051 Idiopathic aseptic necrosis of right femur: Secondary | ICD-10-CM | POA: Diagnosis not present
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
## 2019-04-16 ENCOUNTER — Other Ambulatory Visit: Payer: Self-pay

## 2019-04-17 ENCOUNTER — Ambulatory Visit (INDEPENDENT_AMBULATORY_CARE_PROVIDER_SITE_OTHER): Payer: 59 | Admitting: Obstetrics and Gynecology

## 2019-04-17 ENCOUNTER — Encounter: Payer: Self-pay | Admitting: Obstetrics and Gynecology

## 2019-04-17 VITALS — BP 118/78 | Ht 66.0 in | Wt 184.0 lb

## 2019-04-17 DIAGNOSIS — Z01419 Encounter for gynecological examination (general) (routine) without abnormal findings: Secondary | ICD-10-CM

## 2019-04-17 DIAGNOSIS — Z1151 Encounter for screening for human papillomavirus (HPV): Secondary | ICD-10-CM

## 2019-04-17 NOTE — Progress Notes (Signed)
   Janet May Jan 09, 1971 [id number]  SUBJECTIVE:  49 y.o. [id number] female for annual routine gynecologic exam and Pap smear. She has had shortening of her menstrual cycle down to 25 days, with menstruation for 3-4 days with mild-moderate cramps.  She has had mild hot flashes and some night sweats.  She does typically wake up about 3-4 AM every morning but then can fall back asleep.  She is having hip surgery at the end of the month.    Current Outpatient Medications  Medication Sig Dispense Refill  . fluticasone (FLONASE) 50 MCG/ACT nasal spray Place 2 sprays into both nostrils daily. 16 g 5  . ibuprofen (ADVIL,MOTRIN) 400 MG tablet Take 400 mg by mouth every 6 (six) hours as needed.    Marland Kitchen omeprazole (PRILOSEC) 20 MG capsule Take 1 capsule (20 mg total) by mouth daily. 30 capsule 5  . pseudoephedrine (SUDAFED) 60 MG tablet Take 1 tablet (60 mg total) by mouth every 4 (four) hours as needed for congestion. 30 tablet 0   No current facility-administered medications for this visit.   Allergies: Sulfa antibiotics  Patient's last menstrual period was 04/12/2019.  Past medical history,surgical history, problem list, medications, allergies, family history and social history were all reviewed and documented as reviewed in the EPIC chart.  ROS:  Feeling well. No dyspnea or chest pain on exertion.  No abdominal pain, change in bowel habits, black or bloody stools.  No urinary tract symptoms. GYN ROS: menstrual cycle changes as above, no pelvic pain or discharge, no breast pain or new or enlarging lumps on self exam. No neurological complaints.   OBJECTIVE:  BP 118/78   Ht 5\' 6"  (1.676 m)   Wt 184 lb (83.5 kg)   LMP 04/12/2019   BMI 29.70 kg/m  The patient appears well, alert, oriented x 3, in no distress. ENT normal.  Neck supple. No cervical or supraclavicular adenopathy or thyromegaly.  Abdomen soft without tenderness, guarding, mass or organomegaly.  Neurological is normal, no focal  findings.  BREAST EXAM: breasts appear normal, no suspicious masses, no skin or nipple changes or axillary nodes  PELVIC EXAM: VULVA: normal appearing vulva with no masses, tenderness or lesions, VAGINA: normal appearing vagina with normal color and discharge, no lesions, CERVIX: normal appearing cervix without discharge or lesions, UTERUS: uterus is normal size, shape, consistency and nontender, ADNEXA: normal adnexa in size, nontender and no masses  Chaperone: Caryn Bee present during the examination  ASSESSMENT:  49 y.o. [id number] here for annual gynecologic exam  PLAN:   1. Perimenopausal changes and symptoms reviewed.  She should let us know if she is having any heavy vaginal bleeding or abnormal increase in menses. Tubal ligation for contraception. 2. Pap smear collected.  Last year 04/17/18 she had normal cytology pap smear, +HPV, negative for type 16, 18, 45.  Pap smear screening guidelines reviewed and a Pap smear is repeated today.  3. Mammogram . Will continue with annual mammography. Breast exam normal today.  Will contact our staff to help coordinate. 4. Health maintenance.  She will return another day fasting CBC, CMP, lipid profile, TSH, and vitamin D. 5. Discussed questions about STI screening, available if she has any concerns, she does not have any at this time.  Return annually or sooner, prn.  Joseph Pierini MD  04/17/19

## 2019-04-17 NOTE — Patient Instructions (Addendum)
You are welcome to return another day fasting for your routine blood work screening labs We will let you know about your pap smear results when they have returned Please schedule a mammogram for routine screening

## 2019-04-18 ENCOUNTER — Encounter: Payer: Self-pay | Admitting: *Deleted

## 2019-04-18 LAB — PAP IG AND HPV HIGH-RISK: HPV DNA High Risk: NOT DETECTED

## 2019-04-25 ENCOUNTER — Other Ambulatory Visit: Payer: Self-pay

## 2019-04-26 ENCOUNTER — Encounter: Payer: Self-pay | Admitting: Obstetrics and Gynecology

## 2019-04-26 ENCOUNTER — Ambulatory Visit (INDEPENDENT_AMBULATORY_CARE_PROVIDER_SITE_OTHER): Payer: 59 | Admitting: Obstetrics and Gynecology

## 2019-04-26 VITALS — BP 118/74

## 2019-04-26 DIAGNOSIS — R87615 Unsatisfactory cytologic smear of cervix: Secondary | ICD-10-CM | POA: Diagnosis not present

## 2019-04-26 DIAGNOSIS — Z1151 Encounter for screening for human papillomavirus (HPV): Secondary | ICD-10-CM

## 2019-04-26 DIAGNOSIS — Z124 Encounter for screening for malignant neoplasm of cervix: Secondary | ICD-10-CM

## 2019-04-26 DIAGNOSIS — N898 Other specified noninflammatory disorders of vagina: Secondary | ICD-10-CM | POA: Diagnosis not present

## 2019-04-26 DIAGNOSIS — Z01419 Encounter for gynecological examination (general) (routine) without abnormal findings: Secondary | ICD-10-CM

## 2019-04-26 DIAGNOSIS — R87612 Low grade squamous intraepithelial lesion on cytologic smear of cervix (LGSIL): Secondary | ICD-10-CM | POA: Diagnosis not present

## 2019-04-26 LAB — WET PREP FOR TRICH, YEAST, CLUE

## 2019-04-26 MED ORDER — FLUCONAZOLE 150 MG PO TABS: 150.0000 mg | ORAL_TABLET | ORAL | 0 refills | Status: DC | PRN

## 2019-04-26 MED FILL — FLUCONAZOLE 150 MG TABS: 150 | 6 days supply | Qty: 2 | Fill #0

## 2019-04-26 NOTE — Patient Instructions (Addendum)
We did routine fasting blood work today and will let you know the results of your pap smear Diflucan was prescribed to your pharmacy for possible yeast infection

## 2019-04-26 NOTE — Progress Notes (Signed)
   Janet May  [birth year] [id number]  HPI The patient is a 49 y.o. [id number] who presents today for a Pap smear only.  She had an annual exam on 04/17/2019 and the Pap smear was inadequate for processing.  She also has noticed some vaginal itching and slight whitish discharge after her period ended.  She has had this in the past and has had vaginal yeast infections before.  She also is fasting for her routine lab screening.  Past medical history,surgical history, problem list, medications, allergies, family history and social history were all reviewed and documented as reviewed in the EPIC chart.  ROS: GYN ROS: normal menses, no abnormal bleeding, no pelvic pain , + discharge, no breast pain or new or enlarging lumps on self exam. No neurological complaints.  Physical Exam  BP 118/74   LMP 04/12/2019   General: Pleasant female, no acute distress, alert and oriented PELVIC EXAM: VULVA: normal appearing vulva with no masses, tenderness or lesions, VAGINA: normal appearing vagina with normal color, scant thick white discharge, no lesions, CERVIX: viewed best with Graves speculum, normal appearing cervix without discharge or lesions, UTERUS: uterus is normal size, shape, consistency and nontender, ADNEXA: normal adnexa in size, nontender and no masses.   WET MOUNT: No hyphae, trichomonads, clue cells.  No WBC, moderate bacteria, 7-12 epithelial cells/hpf.  Chaperone: Caryn Bee present during the examination  Assessment 49 y.o. [phone] here for Pap smear and vaginal candidiasis  Plan She will be notified of the results of her Pap smear when available No evidence of yeast infection but will empirically treat with Diflucan 150 mg p.o., repeat dose in 72 hours if symptoms remain.  Let us know if the symptoms are not improving. Screening labs collected today.  Joseph Pierini MD 04/26/19

## 2019-04-27 LAB — CBC
HCT: 40.4 % (ref 35.0–45.0)
Hemoglobin: 13.2 g/dL (ref 11.7–15.5)
MCH: 29 pg (ref 27.0–33.0)
MCHC: 32.7 g/dL (ref 32.0–36.0)
MCV: 88.8 fL (ref 80.0–100.0)
MPV: 9.8 fL (ref 7.5–12.5)
Platelets: 355 10*3/uL (ref 140–400)
RBC: 4.55 10*6/uL (ref 3.80–5.10)
RDW: 12.3 % (ref 11.0–15.0)
WBC: 5.5 10*3/uL (ref 3.8–10.8)

## 2019-04-27 LAB — COMPREHENSIVE METABOLIC PANEL
AG Ratio: 1.4 (calc) (ref 1.0–2.5)
ALT: 16 U/L (ref 6–29)
AST: 16 U/L (ref 10–35)
Albumin: 4.1 g/dL (ref 3.6–5.1)
Alkaline phosphatase (APISO): 57 U/L (ref 31–125)
BUN: 9 mg/dL (ref 7–25)
CO2: 25 mmol/L (ref 20–32)
Calcium: 9.4 mg/dL (ref 8.6–10.2)
Chloride: 105 mmol/L (ref 98–110)
Creat: 0.87 mg/dL (ref 0.50–1.10)
Globulin: 3 g/dL (calc) (ref 1.9–3.7)
Glucose, Bld: 100 mg/dL — ABNORMAL HIGH (ref 65–99)
Potassium: 5 mmol/L (ref 3.5–5.3)
Sodium: 139 mmol/L (ref 135–146)
Total Bilirubin: 0.5 mg/dL (ref 0.2–1.2)
Total Protein: 7.1 g/dL (ref 6.1–8.1)

## 2019-04-27 LAB — TSH: TSH: 1.51 mIU/L

## 2019-04-27 LAB — LIPID PANEL
Cholesterol: 213 mg/dL — ABNORMAL HIGH (ref <200)
HDL: 52 mg/dL (ref >=50)
LDL Cholesterol (Calc): 136 mg/dL (calc) — ABNORMAL HIGH
Non-HDL Cholesterol (Calc): 161 mg/dL (calc) — ABNORMAL HIGH (ref <130)
Total CHOL/HDL Ratio: 4.1 (calc) (ref <5.0)
Triglycerides: 129 mg/dL (ref <150)

## 2019-04-30 LAB — PAP IG AND HPV HIGH-RISK: HPV DNA High Risk: DETECTED — AB

## 2019-05-01 ENCOUNTER — Other Ambulatory Visit: Payer: Self-pay

## 2019-05-01 NOTE — Progress Notes (Signed)
Please let Janet May know the Pap smear was low grade abnormal, HPV was positive. She will need a colposcopy exam at her convenience.

## 2019-05-02 ENCOUNTER — Other Ambulatory Visit: Payer: Self-pay | Admitting: Physician Assistant

## 2019-05-02 MED FILL — OMEPRAZOLE 40 MG CPDR: 40 | 90 days supply | Qty: 90 | Fill #3

## 2019-05-04 NOTE — Patient Instructions (Signed)
DUE TO COVID-19 ONLY ONE VISITOR IS ALLOWED TO COME WITH YOU AND STAY IN THE WAITING ROOM ONLY DURING PRE OP AND PROCEDURE DAY OF SURGERY. THE 1 VISITOR MAY VISIT WITH YOU AFTER SURGERY IN YOUR PRIVATE ROOM DURING VISITING HOURS ONLY!  YOU NEED TO HAVE A COVID 19 TEST ON__2/23_____ @__10 :15_____, THIS TEST MUST BE DONE BEFORE SURGERY, COME  [place] [place] , [id number].  ([place]) ONCE YOUR COVID TEST IS COMPLETED, PLEASE BEGIN THE QUARANTINE INSTRUCTIONS AS OUTLINED IN YOUR HANDOUT.                Janet May   Your procedure is scheduled on: 05/11/19   Report to [facility] Main  Entrance   Report to admitting at  8:30 AM     Call this number if you have problems the morning of surgery [phone]   . BRUSH YOUR TEETH MORNING OF SURGERY AND RINSE YOUR MOUTH OUT, NO CHEWING GUM CANDY OR MINTS.   Do not eat food After Midnight.   YOU MAY HAVE CLEAR LIQUIDS FROM MIDNIGHT UNTIL 8:00 AM.   At 8:00 AM Please finish the prescribed Pre-Surgery  drink  . Nothing by mouth after you finish the  drink !    Take these medicines the morning of surgery with A SIP OF WATER: Prilosec                                 You may not have any metal on your body including hair pins and              piercings  Do not wear jewelry, make-up, lotions, powders or perfumes, deodorant             Do not wear nail polish on your fingernails.  Do not shave  48 hours prior to surgery.               Do not bring valuables to the hospital. [place].  Contacts, dentures or bridgework may not be worn into surgery.        Special Instructions: N/A              Please read over the following fact sheets you were given: _____________________________________________________________________             [facility] - Preparing for Surgery  Before surgery, you can play an important role.   Because skin is not  sterile, your skin needs to be as free of germs as possible.   You can reduce the number of germs on your skin by washing with CHG (chlorahexidine gluconate) soap before surgery.   CHG is an antiseptic cleaner which kills germs and bonds with the skin to continue killing germs even after washing. Please DO NOT use if you have an allergy to CHG or antibacterial soaps.   If your skin becomes reddened/irritated stop using the CHG and inform your nurse when you arrive at Short Stay. Do not shave (including legs and underarms) for at least 48 hours prior to the first CHG shower.    Please follow these instructions carefully:  1.  Shower with CHG Soap the night before surgery and the  morning of Surgery.  2.  If you choose to wash your hair, wash your hair  first as usual with your  normal  shampoo.  3.  After you shampoo, rinse your hair and body thoroughly to remove the  shampoo.                                        4.  Use CHG as you would any other liquid soap.  You can apply chg directly  to the skin and wash                       Gently with a scrungie or clean washcloth.  5.  Apply the CHG Soap to your body ONLY FROM THE NECK DOWN.   Do not use on face/ open                           Wound or open sores. Avoid contact with eyes, ears mouth and genitals (private parts).                       Wash face,  Genitals (private parts) with your normal soap.             6.  Wash thoroughly, paying special attention to the area where your surgery  will be performed.  7.  Thoroughly rinse your body with warm water from the neck down.  8.  DO NOT shower/wash with your normal soap after using and rinsing off  the CHG Soap.             9.  Pat yourself dry with a clean towel.            10.  Wear clean pajamas.            11.  Place clean sheets on your bed the night of your first shower and do not  sleep with pets. Day of Surgery : Do not apply any lotions/deodorants the morning of surgery.  Please wear  clean clothes to the hospital/surgery center.  FAILURE TO FOLLOW THESE INSTRUCTIONS MAY RESULT IN THE CANCELLATION OF YOUR SURGERY PATIENT SIGNATURE_________________________________  NURSE SIGNATURE__________________________________  ________________________________________________________________________   Janet May  An incentive spirometer is a tool that can help keep your lungs clear and active. This tool measures how well you are filling your lungs with each breath. Taking long deep breaths may help reverse or decrease the chance of developing breathing (pulmonary) problems (especially infection) following:  A long period of time when you are unable to move or be active. BEFORE THE PROCEDURE   If the spirometer includes an indicator to show your best effort, your nurse or respiratory therapist will set it to a desired goal.  If possible, sit up straight or lean slightly forward. Try not to slouch.  Hold the incentive spirometer in an upright position. INSTRUCTIONS FOR USE  1. Sit on the edge of your bed if possible, or sit up as far as you can in bed or on a chair. 2. Hold the incentive spirometer in an upright position. 3. Breathe out normally. 4. Place the mouthpiece in your mouth and seal your lips tightly around it. 5. Breathe in slowly and as deeply as possible, raising the piston or the ball toward the top of the column. 6. Hold your breath for 3-5 seconds or for as long as possible. Allow the piston or ball to  fall to the bottom of the column. 7. Remove the mouthpiece from your mouth and breathe out normally. 8. Rest for a few seconds and repeat Steps 1 through 7 at least 10 times every 1-2 hours when you are awake. Take your time and take a few normal breaths between deep breaths. 9. The spirometer may include an indicator to show your best effort. Use the indicator as a goal to work toward during each repetition. 10. After each set of 10 deep breaths, practice  coughing to be sure your lungs are clear. If you have an incision (the cut made at the time of surgery), support your incision when coughing by placing a pillow or rolled up towels firmly against it. Once you are able to get out of bed, walk around indoors and cough well. You may stop using the incentive spirometer when instructed by your caregiver.  RISKS AND COMPLICATIONS  Take your time so you do not get dizzy or light-headed.  If you are in pain, you may need to take or ask for pain medication before doing incentive spirometry. It is harder to take a deep breath if you are having pain. AFTER USE  Rest and breathe slowly and easily.  It can be helpful to keep track of a log of your progress. Your caregiver can provide you with a simple table to help with this. If you are using the spirometer at home, follow these instructions: [place] IF:   You are having difficultly using the spirometer.  You have trouble using the spirometer as often as instructed.  Your pain medication is not giving enough relief while using the spirometer.  You develop fever of 100.5 F (38.1 C) or higher. SEEK IMMEDIATE MEDICAL CARE IF:   You cough up bloody sputum that had not been present before.  You develop fever of 102 F (38.9 C) or greater.  You develop worsening pain at or near the incision site. MAKE SURE YOU:   Understand these instructions.  Will watch your condition.  Will get help right away if you are not doing well or get worse. Document Released: 07/12/2006 Document Revised: 05/24/2011 Document Reviewed: 09/12/2006 [facility] Patient Information 2014 [place], [place].   ________________________________________________________________________

## 2019-05-07 ENCOUNTER — Other Ambulatory Visit: Payer: Self-pay

## 2019-05-07 ENCOUNTER — Telehealth: Payer: Self-pay | Admitting: Orthopaedic Surgery

## 2019-05-07 ENCOUNTER — Encounter (HOSPITAL_COMMUNITY): Payer: Self-pay

## 2019-05-07 ENCOUNTER — Encounter (HOSPITAL_COMMUNITY)
Admission: RE | Admit: 2019-05-07 | Discharge: 2019-05-07 | Disposition: A | Discharge status: Home / Self Care | Payer: 59 | Source: Ambulatory Visit | Attending: Orthopaedic Surgery | Admitting: Orthopaedic Surgery

## 2019-05-07 DIAGNOSIS — Z20822 Contact with and (suspected) exposure to covid-19: Secondary | ICD-10-CM | POA: Insufficient documentation

## 2019-05-07 DIAGNOSIS — Z01812 Encounter for preprocedural laboratory examination: Secondary | ICD-10-CM | POA: Insufficient documentation

## 2019-05-07 NOTE — Telephone Encounter (Signed)
Patient aware of the below message  

## 2019-05-07 NOTE — Telephone Encounter (Signed)
She will be using a walker first after surgery.  Physical therapy will have a walker for her at the hospital and then the transition care team will help work on setting up her durable medical equipment needs for home.  The implant we are putting in is from [place].  It is titanium with a ceramic head.

## 2019-05-07 NOTE — Progress Notes (Signed)
PCP - N. Wendling DO Cardiologist - none  Chest x-ray - no EKG -no  Stress Test - no ECHO -no  Cardiac Cath - no  Sleep Study - no CPAP -   Fasting Blood Sugar - NA Checks Blood Sugar _____ times a day  Blood Thinner Instructions:NA Aspirin Instructions: Last Dose:  Anesthesia review:   Patient denies shortness of breath, fever, cough and chest pain at PAT appointment yes  Patient verbalized understanding of instructions that were given to them at the PAT appointment. Patient was also instructed that they will need to review over the PAT instructions again at home before surgery. yes

## 2019-05-07 NOTE — Telephone Encounter (Signed)
Please advise 

## 2019-05-07 NOTE — Telephone Encounter (Signed)
Patient called asked if she will need a walker and or crutches? Patient asked what is the implant is made out of because she can not locate the booklet she was given. The number to contact patient is [phone]

## 2019-05-08 ENCOUNTER — Other Ambulatory Visit (HOSPITAL_COMMUNITY)
Admission: RE | Admit: 2019-05-08 | Discharge: 2019-05-08 | Disposition: A | Discharge status: Home / Self Care | Payer: 59 | Source: Ambulatory Visit | Attending: Orthopaedic Surgery | Admitting: Orthopaedic Surgery

## 2019-05-08 ENCOUNTER — Encounter (HOSPITAL_COMMUNITY)
Admission: RE | Admit: 2019-05-08 | Discharge: 2019-05-08 | Disposition: A | Discharge status: Home / Self Care | Payer: 59 | Source: Ambulatory Visit | Attending: Orthopaedic Surgery | Admitting: Orthopaedic Surgery

## 2019-05-08 DIAGNOSIS — Z01812 Encounter for preprocedural laboratory examination: Secondary | ICD-10-CM | POA: Diagnosis not present

## 2019-05-08 DIAGNOSIS — Z20822 Contact with and (suspected) exposure to covid-19: Secondary | ICD-10-CM | POA: Diagnosis not present

## 2019-05-08 LAB — SARS CORONAVIRUS 2 (TAT 6-24 HRS): SARS Coronavirus 2: NEGATIVE

## 2019-05-08 LAB — SURGICAL PCR SCREEN
MRSA, PCR: NEGATIVE
Staphylococcus aureus: NEGATIVE

## 2019-05-08 LAB — ABO/RH: ABO/RH(D): A NEG

## 2019-05-09 ENCOUNTER — Telehealth: Payer: Self-pay | Admitting: Orthopaedic Surgery

## 2019-05-09 NOTE — Telephone Encounter (Signed)
Patient called to check status of [employer] forms. I advised they were faxed today by [employer].

## 2019-05-10 NOTE — Progress Notes (Signed)
Pt aware to arrive at [facility] admitting at [facility] Friday 05/11/2019 due to surgical time change. Pt aware of no food after midnight; clear liquids from midnight till 0530 and drinking pre surgery drink by 0530 then nothing by mouth.

## 2019-05-11 ENCOUNTER — Ambulatory Visit (HOSPITAL_COMMUNITY): Payer: 59 | Admitting: Anesthesiology

## 2019-05-11 ENCOUNTER — Encounter (HOSPITAL_COMMUNITY)
Admission: RE | Disposition: A | Discharge status: Home Health | Payer: Self-pay | Source: Home / Self Care | Attending: Orthopaedic Surgery

## 2019-05-11 ENCOUNTER — Ambulatory Visit (HOSPITAL_COMMUNITY): Payer: 59 | Admitting: Physician Assistant

## 2019-05-11 ENCOUNTER — Ambulatory Visit (HOSPITAL_COMMUNITY): Payer: 59

## 2019-05-11 ENCOUNTER — Observation Stay (HOSPITAL_COMMUNITY)
Admission: RE | Admit: 2019-05-11 | Discharge: 2019-05-12 | Disposition: A | Discharge status: Home Health | Payer: 59 | Attending: Orthopaedic Surgery | Admitting: Orthopaedic Surgery

## 2019-05-11 ENCOUNTER — Other Ambulatory Visit: Payer: Self-pay

## 2019-05-11 ENCOUNTER — Encounter (HOSPITAL_COMMUNITY): Payer: Self-pay | Admitting: Orthopaedic Surgery

## 2019-05-11 ENCOUNTER — Observation Stay (HOSPITAL_COMMUNITY): Payer: 59

## 2019-05-11 DIAGNOSIS — Z419 Encounter for procedure for purposes other than remedying health state, unspecified: Secondary | ICD-10-CM

## 2019-05-11 DIAGNOSIS — Z96641 Presence of right artificial hip joint: Secondary | ICD-10-CM

## 2019-05-11 DIAGNOSIS — Z882 Allergy status to sulfonamides status: Secondary | ICD-10-CM | POA: Insufficient documentation

## 2019-05-11 DIAGNOSIS — M87051 Idiopathic aseptic necrosis of right femur: Secondary | ICD-10-CM | POA: Diagnosis not present

## 2019-05-11 DIAGNOSIS — M879 Osteonecrosis, unspecified: Principal | ICD-10-CM | POA: Insufficient documentation

## 2019-05-11 DIAGNOSIS — M1611 Unilateral primary osteoarthritis, right hip: Secondary | ICD-10-CM | POA: Insufficient documentation

## 2019-05-11 DIAGNOSIS — Z471 Aftercare following joint replacement surgery: Secondary | ICD-10-CM | POA: Diagnosis not present

## 2019-05-11 DIAGNOSIS — K219 Gastro-esophageal reflux disease without esophagitis: Secondary | ICD-10-CM | POA: Diagnosis not present

## 2019-05-11 HISTORY — PX: TOTAL HIP ARTHROPLASTY: SHX124

## 2019-05-11 LAB — TYPE AND SCREEN
ABO/RH(D): A NEG
Antibody Screen: NEGATIVE

## 2019-05-11 LAB — PREGNANCY, URINE: Preg Test, Ur: NEGATIVE

## 2019-05-11 SURGERY — ARTHROPLASTY, HIP, TOTAL, ANTERIOR APPROACH
Anesthesia: Spinal | Site: Hip | Laterality: Right

## 2019-05-11 MED ORDER — PROPOFOL 500 MG/50ML IV EMUL
INTRAVENOUS | Status: DC | PRN
  Administered 2019-05-11: 100 ug/kg/min via INTRAVENOUS

## 2019-05-11 MED ORDER — ONDANSETRON HCL 4 MG PO TABS: 4.0000 mg | ORAL_TABLET | Freq: Four times a day (QID) | ORAL | Status: DC | PRN

## 2019-05-11 MED ORDER — METOCLOPRAMIDE HCL 5 MG PO TABS: 5.0000 mg | ORAL_TABLET | Freq: Three times a day (TID) | ORAL | Status: DC | PRN

## 2019-05-11 MED ORDER — SODIUM CHLORIDE 0.9 % IV SOLN: INTRAVENOUS | Status: DC

## 2019-05-11 MED ORDER — OXYCODONE HCL 5 MG PO TABS: 10.0000 mg | ORAL_TABLET | ORAL | Status: DC | PRN

## 2019-05-11 MED ORDER — DOCUSATE SODIUM 100 MG PO CAPS
100.0000 mg | ORAL_CAPSULE | Freq: Two times a day (BID) | ORAL | Status: DC
  Administered 2019-05-11 – 2019-05-12 (×2): 100 mg via ORAL
  Filled 2019-05-11 (×2): qty 1

## 2019-05-11 MED ORDER — MENTHOL 3 MG MT LOZG: 1.0000 | LOZENGE | OROMUCOSAL | Status: DC | PRN

## 2019-05-11 MED ORDER — POLYETHYLENE GLYCOL 3350 17 G PO PACK: 17.0000 g | PACK | Freq: Every day | ORAL | Status: DC | PRN

## 2019-05-11 MED ORDER — ASPIRIN 81 MG PO CHEW
81.0000 mg | CHEWABLE_TABLET | Freq: Two times a day (BID) | ORAL | Status: DC
  Administered 2019-05-11 – 2019-05-12 (×2): 81 mg via ORAL
  Filled 2019-05-11 (×2): qty 1

## 2019-05-11 MED ORDER — PROMETHAZINE HCL 25 MG/ML IJ SOLN: 6.2500 mg | INTRAMUSCULAR | Status: DC | PRN

## 2019-05-11 MED ORDER — POVIDONE-IODINE 10 % EX SWAB
2.0000 "application " | Freq: Once | CUTANEOUS | Status: AC
  Administered 2019-05-11: 2 via TOPICAL

## 2019-05-11 MED ORDER — 0.9 % SODIUM CHLORIDE (POUR BTL) OPTIME
TOPICAL | Status: DC | PRN
  Administered 2019-05-11: 1000 mL

## 2019-05-11 MED ORDER — ACETAMINOPHEN 325 MG PO TABS
325.0000 mg | ORAL_TABLET | Freq: Four times a day (QID) | ORAL | Status: DC | PRN
  Administered 2019-05-12: 650 mg via ORAL
  Filled 2019-05-11: qty 2

## 2019-05-11 MED ORDER — PANTOPRAZOLE SODIUM 40 MG PO TBEC
40.0000 mg | DELAYED_RELEASE_TABLET | Freq: Every day | ORAL | Status: DC
  Administered 2019-05-11 – 2019-05-12 (×2): 40 mg via ORAL
  Filled 2019-05-11 (×2): qty 1

## 2019-05-11 MED ORDER — ONDANSETRON HCL 4 MG/2ML IJ SOLN
INTRAMUSCULAR | Status: AC
  Filled 2019-05-11: qty 2

## 2019-05-11 MED ORDER — MIDAZOLAM HCL 5 MG/5ML IJ SOLN
INTRAMUSCULAR | Status: DC | PRN
  Administered 2019-05-11: 2 mg via INTRAVENOUS

## 2019-05-11 MED ORDER — ONDANSETRON HCL 4 MG/2ML IJ SOLN
INTRAMUSCULAR | Status: DC | PRN
  Administered 2019-05-11: 4 mg via INTRAVENOUS

## 2019-05-11 MED ORDER — MIDAZOLAM HCL 2 MG/2ML IJ SOLN
INTRAMUSCULAR | Status: AC
  Filled 2019-05-11: qty 2

## 2019-05-11 MED ORDER — ALUM & MAG HYDROXIDE-SIMETH 200-200-20 MG/5ML PO SUSP: 30.0000 mL | ORAL | Status: DC | PRN

## 2019-05-11 MED ORDER — MEPERIDINE HCL 50 MG/ML IJ SOLN: 6.2500 mg | INTRAMUSCULAR | Status: DC | PRN

## 2019-05-11 MED ORDER — HYDROMORPHONE HCL 1 MG/ML IJ SOLN
0.5000 mg | INTRAMUSCULAR | Status: DC | PRN
  Administered 2019-05-11: 0.5 mg via INTRAVENOUS
  Filled 2019-05-11: qty 1

## 2019-05-11 MED ORDER — HYDROMORPHONE HCL 1 MG/ML IJ SOLN
INTRAMUSCULAR | Status: AC
  Filled 2019-05-11: qty 1

## 2019-05-11 MED ORDER — PROPOFOL 500 MG/50ML IV EMUL
INTRAVENOUS | Status: AC
  Filled 2019-05-11: qty 50

## 2019-05-11 MED ORDER — PROPOFOL 10 MG/ML IV BOLUS
INTRAVENOUS | Status: AC
  Filled 2019-05-11: qty 40

## 2019-05-11 MED ORDER — KETOROLAC TROMETHAMINE 15 MG/ML IJ SOLN
7.5000 mg | Freq: Four times a day (QID) | INTRAMUSCULAR | Status: AC
  Administered 2019-05-11 – 2019-05-12 (×4): 7.5 mg via INTRAVENOUS
  Filled 2019-05-11 (×4): qty 1

## 2019-05-11 MED ORDER — DEXAMETHASONE SODIUM PHOSPHATE 10 MG/ML IJ SOLN
INTRAMUSCULAR | Status: DC | PRN
  Administered 2019-05-11: 8 mg via INTRAVENOUS

## 2019-05-11 MED ORDER — KETOROLAC TROMETHAMINE 30 MG/ML IJ SOLN
30.0000 mg | Freq: Once | INTRAMUSCULAR | Status: AC | PRN
  Administered 2019-05-11: 30 mg via INTRAVENOUS

## 2019-05-11 MED ORDER — CHLORHEXIDINE GLUCONATE 4 % EX LIQD: 60.0000 mL | Freq: Once | CUTANEOUS | Status: DC

## 2019-05-11 MED ORDER — ONDANSETRON HCL 4 MG/2ML IJ SOLN: 4.0000 mg | Freq: Four times a day (QID) | INTRAMUSCULAR | Status: DC | PRN

## 2019-05-11 MED ORDER — TRANEXAMIC ACID-NACL 1000-0.7 MG/100ML-% IV SOLN
1000.0000 mg | INTRAVENOUS | Status: AC
  Administered 2019-05-11: 1000 mg via INTRAVENOUS
  Filled 2019-05-11: qty 100

## 2019-05-11 MED ORDER — KETOROLAC TROMETHAMINE 30 MG/ML IJ SOLN
INTRAMUSCULAR | Status: AC
  Filled 2019-05-11: qty 1

## 2019-05-11 MED ORDER — DEXAMETHASONE SODIUM PHOSPHATE 10 MG/ML IJ SOLN
INTRAMUSCULAR | Status: AC
  Filled 2019-05-11: qty 1

## 2019-05-11 MED ORDER — SODIUM CHLORIDE 0.9 % IR SOLN
Status: DC | PRN
  Administered 2019-05-11: 1000 mL

## 2019-05-11 MED ORDER — CEFAZOLIN SODIUM-DEXTROSE 2-4 GM/100ML-% IV SOLN
2.0000 g | INTRAVENOUS | Status: AC
  Administered 2019-05-11: 2 g via INTRAVENOUS
  Filled 2019-05-11: qty 100

## 2019-05-11 MED ORDER — PROPOFOL 10 MG/ML IV BOLUS
INTRAVENOUS | Status: DC | PRN
  Administered 2019-05-11 (×3): 20 mg via INTRAVENOUS
  Administered 2019-05-11: 40 mg via INTRAVENOUS

## 2019-05-11 MED ORDER — BUPIVACAINE IN DEXTROSE 0.75-8.25 % IT SOLN
INTRATHECAL | Status: DC | PRN
  Administered 2019-05-11: 1.6 mL via INTRATHECAL

## 2019-05-11 MED ORDER — HYDROMORPHONE HCL 1 MG/ML IJ SOLN
0.2500 mg | INTRAMUSCULAR | Status: DC | PRN
  Administered 2019-05-11 (×2): 0.5 mg via INTRAVENOUS

## 2019-05-11 MED ORDER — LACTATED RINGERS IV SOLN: INTRAVENOUS | Status: DC

## 2019-05-11 MED ORDER — GABAPENTIN 100 MG PO CAPS
100.0000 mg | ORAL_CAPSULE | Freq: Three times a day (TID) | ORAL | Status: DC
  Administered 2019-05-11 – 2019-05-12 (×3): 100 mg via ORAL
  Filled 2019-05-11 (×3): qty 1

## 2019-05-11 MED ORDER — OXYCODONE HCL 5 MG PO TABS
5.0000 mg | ORAL_TABLET | ORAL | Status: DC | PRN
  Administered 2019-05-11 – 2019-05-12 (×3): 5 mg via ORAL
  Administered 2019-05-12: 10 mg via ORAL
  Filled 2019-05-11 (×2): qty 1
  Filled 2019-05-11: qty 2
  Filled 2019-05-11: qty 1
  Filled 2019-05-11 (×2): qty 2

## 2019-05-11 MED ORDER — STERILE WATER FOR IRRIGATION IR SOLN
Status: DC | PRN
  Administered 2019-05-11: 2000 mL

## 2019-05-11 MED ORDER — PHENOL 1.4 % MT LIQD: 1.0000 | OROMUCOSAL | Status: DC | PRN

## 2019-05-11 MED ORDER — METOCLOPRAMIDE HCL 5 MG/ML IJ SOLN: 5.0000 mg | Freq: Three times a day (TID) | INTRAMUSCULAR | Status: DC | PRN

## 2019-05-11 SURGICAL SUPPLY — 39 items
BAG ZIPLOCK 12X15 (MISCELLANEOUS) IMPLANT
BALL HIP CERAMIC (Hips) ×1 IMPLANT
BENZOIN TINCTURE PRP APPL 2/3 (GAUZE/BANDAGES/DRESSINGS) IMPLANT
BLADE SAW SGTL 18X1.27X75 (BLADE) ×2 IMPLANT
COVER PERINEAL POST (MISCELLANEOUS) ×2 IMPLANT
COVER SURGICAL LIGHT HANDLE (MISCELLANEOUS) ×2 IMPLANT
COVER WAND RF STERILE (DRAPES) ×2 IMPLANT
CUP SECTOR GRIPTON 50MM (Cup) ×2 IMPLANT
DRAPE STERI IOBAN 125X83 (DRAPES) ×2 IMPLANT
DRAPE U-SHAPE 47X51 STRL (DRAPES) ×4 IMPLANT
DRSG AQUACEL AG ADV 3.5X10 (GAUZE/BANDAGES/DRESSINGS) ×2 IMPLANT
DURAPREP 26ML APPLICATOR (WOUND CARE) ×2 IMPLANT
ELECT REM PT RETURN 15FT ADLT (MISCELLANEOUS) ×2 IMPLANT
GAUZE XEROFORM 1X8 LF (GAUZE/BANDAGES/DRESSINGS) ×2 IMPLANT
GLOVE BIO SURGEON STRL SZ7.5 (GLOVE) ×2 IMPLANT
GLOVE BIOGEL PI IND STRL 8 (GLOVE) ×2 IMPLANT
GLOVE BIOGEL PI INDICATOR 8 (GLOVE) ×2
GLOVE ECLIPSE 8.0 STRL XLNG CF (GLOVE) ×2 IMPLANT
GOWN STRL REUS W/TWL XL LVL3 (GOWN DISPOSABLE) ×4 IMPLANT
HANDPIECE INTERPULSE COAX TIP (DISPOSABLE) ×1
HIP BALL CERAMIC (Hips) ×2 IMPLANT
HOLDER FOLEY CATH W/STRAP (MISCELLANEOUS) ×2 IMPLANT
KIT TURNOVER KIT A (KITS) IMPLANT
LINER ACETABULAR 32X50 (Liner) ×2 IMPLANT
PACK ANTERIOR HIP CUSTOM (KITS) ×2 IMPLANT
PENCIL SMOKE EVACUATOR (MISCELLANEOUS) IMPLANT
SET HNDPC FAN SPRY TIP SCT (DISPOSABLE) ×1 IMPLANT
STAPLER VISISTAT 35W (STAPLE) ×2 IMPLANT
STEM FEM ACTIS HIGH SZ3 (Stem) ×2 IMPLANT
STRIP CLOSURE SKIN 1/2X4 (GAUZE/BANDAGES/DRESSINGS) IMPLANT
SUT ETHIBOND NAB CT1 #1 30IN (SUTURE) ×2 IMPLANT
SUT ETHILON 2 0 PS N (SUTURE) IMPLANT
SUT MNCRL AB 4-0 PS2 18 (SUTURE) IMPLANT
SUT VIC AB 0 CT1 36 (SUTURE) ×2 IMPLANT
SUT VIC AB 1 CT1 36 (SUTURE) ×2 IMPLANT
SUT VIC AB 2-0 CT1 27 (SUTURE) ×2
SUT VIC AB 2-0 CT1 TAPERPNT 27 (SUTURE) ×2 IMPLANT
TRAY FOLEY MTR SLVR 14FR STAT (SET/KITS/TRAYS/PACK) ×2 IMPLANT
YANKAUER SUCT BULB TIP 10FT TU (MISCELLANEOUS) ×2 IMPLANT

## 2019-05-11 NOTE — Evaluation (Signed)
Physical Therapy Evaluation Patient Details Name: Janet May MRN: [id number] DOB: 1971/01/26 Today's Date: 05/11/2019   History of Present Illness  Pt s/p R THR 2* AVN  Clinical Impression  Pt s/p R THR and presents with decreased L LE strength/ROM and post op pain limiting functional mobility.  Pt should progress to dc home with family assist.    Follow Up Recommendations Home health PT;Follow surgeon's recommendation for DC plan and follow-up therapies    Equipment Recommendations  Rolling walker with 5" wheels    Recommendations for Other Services       Precautions / Restrictions Precautions Precautions: Fall Restrictions Weight Bearing Restrictions: No Other Position/Activity Restrictions: WBAT      Mobility  Bed Mobility Overal bed mobility: Needs Assistance Bed Mobility: Supine to Sit     Supine to sit: Min assist     General bed mobility comments: cues for sequence and use of L LE to self assist - physical assist to manage R LE and to bring trunk to upright  Transfers Overall transfer level: Needs assistance Equipment used: Rolling walker (2 wheeled) Transfers: Sit to/from Stand Sit to Stand: Min assist;From elevated surface         General transfer comment: cues for LE management and use of UEs to self assist  Ambulation/Gait Ambulation/Gait assistance: Min assist Gait Distance (Feet): 60 Feet Assistive device: Rolling walker (2 wheeled) Gait Pattern/deviations: Step-to pattern;Decreased step length - right;Decreased step length - left;Shuffle;Trunk flexed Gait velocity: decr   General Gait Details: cues for posture, position from RW and intial sequence  Stairs            Wheelchair Mobility    Modified Rankin (Stroke Patients Only)       Balance Overall balance assessment: Mild deficits observed, not formally tested                                           Pertinent Vitals/Pain Pain Assessment:  0-10 Pain Score: 4  Pain Location: R hip Pain Descriptors / Indicators: Aching;Sore Pain Intervention(s): Limited activity within patient's tolerance;Monitored during session;Premedicated before session;Ice applied    Home Living Family/patient expects to be discharged to:: Private residence Living Arrangements: Children Available Help at Discharge: Family Type of Home: House Home Access: Level entry     Home Layout: One level Home Equipment: Toilet riser      Prior Function Level of Independence: Independent               Hand Dominance        Extremity/Trunk Assessment        Lower Extremity Assessment Lower Extremity Assessment: RLE deficits/detail    Cervical / Trunk Assessment Cervical / Trunk Assessment: Normal  Communication   Communication: No difficulties  Cognition Arousal/Alertness: Awake/alert Behavior During Therapy: WFL for tasks assessed/performed Overall Cognitive Status: Within Functional Limits for tasks assessed                                        General Comments      Exercises Total Joint Exercises Ankle Circles/Pumps: AROM;Both;15 reps;Supine   Assessment/Plan    PT Assessment Patient needs continued PT services  PT Problem List Decreased strength;Decreased range of motion;Decreased activity tolerance;Decreased balance;Decreased mobility;Decreased knowledge of use of DME;Pain  PT Treatment Interventions DME instruction;Gait training;Stair training;Functional mobility training;Therapeutic activities;Therapeutic exercise;Patient/family education;Balance training    PT Goals (Current goals can be found in the Care Plan section)  Acute Rehab PT Goals Patient Stated Goal: Regain IND PT Goal Formulation: With patient Time For Goal Achievement: 05/18/19 Potential to Achieve Goals: Good    Frequency 7X/week   Barriers to discharge        Co-evaluation               AM-PAC PT "6 Clicks"  Mobility  Outcome Measure Help needed turning from your back to your side while in a flat bed without using bedrails?: A Little Help needed moving from lying on your back to sitting on the side of a flat bed without using bedrails?: A Little Help needed moving to and from a bed to a chair (including a wheelchair)?: A Little Help needed standing up from a chair using your arms (e.g., wheelchair or bedside chair)?: A Little Help needed to walk in hospital room?: A Little Help needed climbing 3-5 steps with a railing? : A Lot 6 Click Score: 17    End of Session Equipment Utilized During Treatment: Gait belt Activity Tolerance: Patient tolerated treatment well Patient left: in chair;with call bell/phone within reach;with chair alarm set Nurse Communication: Mobility status PT Visit Diagnosis: Difficulty in walking, not elsewhere classified (R26.2)    Time: [id number] PT Time Calculation (min) (ACUTE ONLY): 29 min   Charges:   PT Evaluation $PT Eval Low Complexity: 1 Low PT Treatments $Gait Training: 8-22 mins        [place] Pager [phone] Office [phone]   Fain Francis 05/11/2019, 3:34 PM

## 2019-05-11 NOTE — Anesthesia Preprocedure Evaluation (Signed)
Anesthesia Evaluation  Patient identified by MRN, date of birth, ID band Patient awake    Reviewed: Allergy & Precautions, NPO status , Patient's Chart, lab work & pertinent test results  Airway Mallampati: I       Dental no notable dental hx. (+) Teeth Intact   Pulmonary neg pulmonary ROS,    Pulmonary exam normal breath sounds clear to auscultation       Cardiovascular negative cardio ROS Normal cardiovascular exam Rhythm:Regular Rate:Normal     Neuro/Psych negative neurological ROS  negative psych ROS   GI/Hepatic Neg liver ROS, GERD  Medicated and Controlled,  Endo/Other  negative endocrine ROS  Renal/GU negative Renal ROS  negative genitourinary   Musculoskeletal negative musculoskeletal ROS (+)   Abdominal Normal abdominal exam  (+)   Peds negative pediatric ROS (+)  Hematology negative hematology ROS (+)   Anesthesia Other Findings   Reproductive/Obstetrics negative OB ROS                             Anesthesia Physical Anesthesia Plan  ASA: II  Anesthesia Plan: Spinal   Post-op Pain Management:    Induction:   PONV Risk Score and Plan: 3 and Ondansetron, Dexamethasone, Midazolam and Propofol infusion  Airway Management Planned: Natural Airway, Nasal Cannula and Simple Face Mask  Additional Equipment: None  Intra-op Plan:   Post-operative Plan:   Informed Consent: I have reviewed the patients History and Physical, chart, labs and discussed the procedure including the risks, benefits and alternatives for the proposed anesthesia with the patient or authorized representative who has indicated his/her understanding and acceptance.       Plan Discussed with: CRNA  Anesthesia Plan Comments:         Anesthesia Quick Evaluation

## 2019-05-11 NOTE — Transfer of Care (Signed)
Immediate Anesthesia Transfer of Care Note  Patient: Janet May  Procedure(s) Performed: Procedure(s): RIGHT TOTAL HIP ARTHROPLASTY ANTERIOR APPROACH (Right)  Patient Location: PACU  Anesthesia Type:Spinal  Level of Consciousness:  sedated, patient cooperative and responds to stimulation  Airway & Oxygen Therapy:Patient Spontanous Breathing and Patient connected to face mask oxgen  Post-op Assessment:  Report given to PACU RN and Post -op Vital signs reviewed and stable  Post vital signs:  Reviewed and stable  Last Vitals:  Vitals:   05/11/19 0659 05/11/19 1015  BP: 139/88 135/87  Pulse: 100 (!) 109  Resp: 12 15  Temp: 37.1 C (!) 36.4 C  SpO2: [id number]    Complications: No apparent anesthesia complications

## 2019-05-11 NOTE — Anesthesia Procedure Notes (Signed)
Spinal  Patient location during procedure: OR Start time: 05/11/2019 8:37 AM End time: 05/11/2019 8:42 AM Reason for block: at surgeon's request Staffing Performed: resident/CRNA  Resident/CRNA: Anne Fu, CRNA Preanesthetic Checklist Completed: patient identified, IV checked, site marked, risks and benefits discussed, surgical consent, monitors and equipment checked, pre-op evaluation and timeout performed Spinal Block Patient position: sitting Prep: DuraPrep Patient monitoring: heart rate, continuous pulse ox and blood pressure Approach: midline Location: L2-3 Injection technique: single-shot Needle Needle type: Pencan  Needle gauge: 24 G Needle length: 9 cm Assessment Sensory level: T6 Additional Notes  Functioning IV was confirmed and monitors were applied. Expiration date of kit checked and confirmed. Sterile prep and drape, including hand hygiene and sterile gloves were used. The patient was positioned and the spine was prepped. The skin was anesthetized with lidocaine.  Free flow of clear CSF was obtained prior to injecting local anesthetic into the CSF X 1 attempt.  The spinal needle aspirated freely following injection.  The needle was carefully withdrawn. Patient tolerated procedure well, without complications. Loss of motor and sensory on exam post injection.

## 2019-05-11 NOTE — Plan of Care (Signed)
Plan of care 

## 2019-05-11 NOTE — Brief Op Note (Signed)
05/11/2019  9:54 AM  PATIENT:  Myrna Blazer  49 y.o. female  PRE-OPERATIVE DIAGNOSIS:  osteoarthritis right hip  POST-OPERATIVE DIAGNOSIS:  osteoarthritis right hip  PROCEDURE:  Procedure(s): RIGHT TOTAL HIP ARTHROPLASTY ANTERIOR APPROACH (Right)  SURGEON:  Surgeon(s) and Role:    Mcarthur Rossetti, MD - Primary  PHYSICIAN ASSISTANT:  Benita Stabile, PA-C  ANESTHESIA:   spinal  EBL:  200 mL   COUNTS:  YES  DICTATION: .Other Dictation: Dictation Number [id number]  PLAN OF CARE: Admit for overnight observation  PATIENT DISPOSITION:  PACU - hemodynamically stable.   Delay start of Pharmacological VTE agent (>24hrs) due to surgical blood loss or risk of bleeding: no

## 2019-05-11 NOTE — H&P (Signed)
TOTAL HIP ADMISSION H&P  Patient is admitted for right total hip arthroplasty.  Subjective:  Chief Complaint: right hip pain  HPI: Janet May, 49 y.o. female, has a history of pain and functional disability in the right hip(s) due to avascular necrosis and patient has failed non-surgical conservative treatments for greater than 12 weeks to include NSAID's and/or analgesics, corticosteriod injections, use of assistive devices and activity modification.  Onset of symptoms was abrupt starting 1 years ago with rapidlly worsening course since that time.The patient noted no past surgery on the right hip(s).  Patient currently rates pain in the right hip at 10 out of 10 with activity. Patient has night pain, worsening of pain with activity and weight bearing, trendelenberg gait, pain that interfers with activities of daily living and pain with passive range of motion. Patient has evidence of subchondral cysts and joint space narrowing by imaging studies. This condition presents safety issues increasing the risk of falls.  There is no current active infection.  Patient Active Problem List   Diagnosis Date Noted  . Avascular necrosis of bone of hip, right ([place]) 02/22/2018   Past Medical History:  Diagnosis Date  . AVN (avascular necrosis of bone) ([place])    right hip  . GERD (gastroesophageal reflux disease)     Past Surgical History:  Procedure Laterality Date  . CESAREAN SECTION  2001  . CESAREAN SECTION  2002  . TUBAL LIGATION  2005    Current Facility-Administered Medications  Medication Dose Route Frequency Provider Last Rate Last Admin  . ceFAZolin (ANCEF) IVPB 2g/100 mL premix  2 g Intravenous On Call to OR Pete Pelt, PA-C      . chlorhexidine (HIBICLENS) 4 % liquid 4 application  60 mL Topical Once Erskine Emery W, PA-C      . lactated ringers infusion   Intravenous Continuous Hatchett, Mateo Flow, MD      . povidone-iodine 10 % swab 2 application  2 application Topical  Once Pete Pelt, PA-C      . tranexamic acid (CYKLOKAPRON) IVPB 1,000 mg  1,000 mg Intravenous To OR Pete Pelt, PA-C       Allergies  Allergen Reactions  . Sulfa Antibiotics Hives    Social History   Tobacco Use  . Smoking status: Never Smoker  . Smokeless tobacco: Never Used  Substance Use Topics  . Alcohol use: Yes    Comment: Occas wine    Family History  Problem Relation Age of Onset  . Diabetes Mother   . Hypertension Mother   . Cancer Father 14       lung cancer     Review of Systems  Musculoskeletal: Positive for joint swelling.  All other systems reviewed and are negative.   Objective:  Physical Exam  Constitutional: She is oriented to person, place, and time. She appears well-developed and well-nourished.  HENT:  Head: Normocephalic and atraumatic.  Eyes: Pupils are equal, round, and reactive to light. EOM are normal.  Cardiovascular: Normal rate.  Respiratory: Effort normal.  GI: Soft.  Musculoskeletal:     Cervical back: Normal range of motion and neck supple.     Right hip: Tenderness and bony tenderness present. Decreased range of motion. Decreased strength.  Neurological: She is alert and oriented to person, place, and time.  Skin: Skin is warm and dry.  Psychiatric: She has a normal mood and affect.    Vital signs in last 24 hours: Temp:  [98.8 F (37.1 C)]  98.8 F (37.1 C) (02/26 0659) Pulse Rate:  [100] 100 (02/26 0659) Resp:  [12] 12 (02/26 0659) BP: (139)/(88) 139/88 (02/26 0659) SpO2:  [99 %] 99 % (02/26 0659) Weight:  [81.6 kg] 81.6 kg (02/26 0659)  Labs:   Estimated body mass index is 29.94 kg/m as calculated from the following:   Height as of 05/08/19: 5\' 5"  (1.651 m).   Weight as of this encounter: 81.6 kg.   Imaging Review Plain radiographs demonstrate severe avascular necrosis of the right hip(s). The bone quality appears to be excellent for age and reported activity level.      Assessment/Plan:  End  stage avascular necrosis, right hip(s)  The patient history, physical examination, clinical judgement of the provider and imaging studies are consistent with end stage AVN of the right hip(s) and total hip arthroplasty is deemed medically necessary. The treatment options including medical management, injection therapy, arthroscopy and arthroplasty were discussed at length. The risks and benefits of total hip arthroplasty were presented and reviewed. The risks due to aseptic loosening, infection, stiffness, dislocation/subluxation,  thromboembolic complications and other imponderables were discussed.  The patient acknowledged the explanation, agreed to proceed with the plan and consent was signed. Patient is being admitted for inpatient treatment for surgery, pain control, PT, OT, prophylactic antibiotics, VTE prophylaxis, progressive ambulation and ADL's and discharge planning.The patient is planning to be discharged home with home health services    Patient's anticipated LOS is less than 2 midnights, meeting these requirements: - Younger than 39 - Lives within 1 hour of care - Has a competent adult at home to recover with post-op recover - NO history of  - Chronic pain requiring opiods  - Diabetes  - Coronary Artery Disease  - Heart failure  - Heart attack  - Stroke  - DVT/VTE  - Cardiac arrhythmia  - Respiratory Failure/COPD  - Renal failure  - Anemia  - Advanced Liver disease

## 2019-05-11 NOTE — Anesthesia Postprocedure Evaluation (Signed)
Anesthesia Post Note  Patient: Janet May  Procedure(s) Performed: RIGHT TOTAL HIP ARTHROPLASTY ANTERIOR APPROACH (Right Hip)     Patient location during evaluation: PACU Anesthesia Type: Spinal Level of consciousness: sedated Pain management: pain level controlled Vital Signs Assessment: post-procedure vital signs reviewed and stable Respiratory status: spontaneous breathing Cardiovascular status: stable Postop Assessment: no headache, no backache, spinal receding and no apparent nausea or vomiting Anesthetic complications: no    Last Vitals:  Vitals:   05/11/19 1040 05/11/19 1045  BP:  125/86  Pulse: 89 91  Resp: 20 19  Temp:    SpO2: 99% 100%    Last Pain:  Vitals:   05/11/19 1040  TempSrc:   PainSc: Asleep   Pain Goal: Patients Stated Pain Goal: 3 (05/11/19 0719)                 Huston Foley

## 2019-05-11 NOTE — Op Note (Signed)
NAME: Janet May, Janet May MEDICAL RECORD [id number] ACCOUNT [id number] DATE OF BIRTH:09-Apr-1970 FACILITY: [facility] LOCATION: [id number] PHYSICIAN:Andie Mortimer Kerry Fort, MD  OPERATIVE REPORT  DATE OF PROCEDURE:  05/11/2019  PREOPERATIVE DIAGNOSIS:  Avascular necrosis, right hip.  POSTOPERATIVE DIAGNOSIS:  Avascular necrosis, right hip.  PROCEDURE:  Right total hip arthroplasty through direct anterior approach.  IMPLANTS:  DePuy Sector Gription acetabular component size 50, size 32+0 neutral polyethylene liner, size 3 Actis femoral component with high offset, size 32+5 ceramic hip ball.  SURGEON:  Lind Guest.  Ninfa Linden, MD  ASSISTANT:  Erskine Emery, PA-C.  ANESTHESIA:  Spinal.  ANTIBIOTICS:  Two g IV Ancef.  ESTIMATED BLOOD LOSS:  250 mL.  COMPLICATIONS:  None.  INDICATIONS:  The patient is a 49 year old female with known avascular necrosis of her right hip with uncertain etiology.  This has been confirmed with plain films and MRI.  Her hip pain is daily and it is severe and has become rapidly worse over the  last several months.  At this point, we have recommended total hip arthroplasty through direct anterior approach.  We talked in detail about the risk of acute blood loss anemia, nerve or vessel injury, fracture, infection, dislocation, DVT and implant  failure.  We talked about our goals being decreased pain, improved mobility and overall improved quality of life.  DESCRIPTION OF PROCEDURE:  After informed consent was obtained and appropriate right hip was marked, she was brought to the operating room, sat up on a stretcher where spinal anesthesia was obtained.  She was then laid in supine position on a stretcher.   Traction boots were placed on both her feet.  She was then placed supine on the Hana fracture table, the perineal post in place and both legs in line skeletal traction device and no traction applied.  Her right operative hip was prepped and draped with  DuraPrep  and sterile drapes.  A time-out was called.  She was identified as correct patient, correct right hip.  I then made an incision just inferior and posterior to the anterior superior iliac spine and carried this obliquely down the leg.  We  dissected down to tensor fascia lata muscle.  Tensor fascia was then divided longitudinally to proceed with direct anterior approach to the hip.  We identified and cauterized  circumflex vessels and identified the hip capsule, opened up the hip capsule  in an L-type format, finding a moderate joint effusion and significant cartilage wear from AVN with flaking around the femoral head.  We placed curved Cobra retractors around the medial and lateral femoral neck and then made our femoral neck cut with an  oscillating saw just proximal to the lesser trochanter and completed this with an osteotome.  We placed a corkscrew guide in the femoral head and removed the femoral head in its entirety and passed it off the back side of the table.  We then placed a  bent Hohmann over the medial acetabular rim and removed remnants of the acetabular labrum and other debris.  We then began reaming under direct visualization from a size 46 reamer in stepwise increments up to a size 49.  With all reamers under direct  visualization, the last reamer under direct fluoroscopy, so I could obtain my depth of reaming by inclination and anteversion.  I then placed the real DePuy Sector Gription acetabular component size 50 and a 32+0 neutral polyethylene liner for that size  acetabular component.  Attention was then turned to the femur.  With the  leg externally rotated to 120 degrees, extended and adducted, we were able to place a Mueller retractor medially and a Hohman retractor behind the greater trochanter, released  lateral joint capsule and used a box-cutting osteotome to enter the femoral canal and a rongeur to lateralize.  We then began broaching using the Actis broaching system from a size zero  up to a size 3.  With the size 3 in place, we trialed a standard  offset femoral neck and a 32+1 hip ball, reduced this in the acetabulum and it was definitely unstable.  I felt like we needed more leg length and offset.  We dislocated the hip and removed the trial components.  I then placed a high offset Actis femoral  component and we went with a 32+5 ceramic hip ball, again reduced this in the acetabulum.  We were definitely pleased with leg length, offset, range of motion and stability assessed mechanically and radiographically.  We then irrigated the soft tissue  with normal saline solution using pulsatile lavage.  We were able to close the joint capsule with interrupted #1 Ethibond suture.  The tensor fascia was closed with #1 Vicryl, followed by 0 Vicryl to close deep tissue, 2-0 Vicryl was used to close  subcutaneous tissue and the  skin was closed with staples.  Xeroform and Aquacel dressing was applied.  She was taken off the Hana table and taken to the recovery room in stable condition.  All final counts were correct.  There were no complications  noted.  Of note Benita Stabile, PA-C, assisted during the entire case.  His assistance was crucial for facilitating all aspects of this case.  VN/NUANCE  D:05/11/2019 T:05/11/2019 JOB:[id number]

## 2019-05-12 DIAGNOSIS — K219 Gastro-esophageal reflux disease without esophagitis: Secondary | ICD-10-CM | POA: Diagnosis not present

## 2019-05-12 DIAGNOSIS — M87051 Idiopathic aseptic necrosis of right femur: Secondary | ICD-10-CM | POA: Diagnosis not present

## 2019-05-12 DIAGNOSIS — M879 Osteonecrosis, unspecified: Secondary | ICD-10-CM | POA: Diagnosis not present

## 2019-05-12 DIAGNOSIS — M1611 Unilateral primary osteoarthritis, right hip: Secondary | ICD-10-CM | POA: Diagnosis not present

## 2019-05-12 DIAGNOSIS — Z882 Allergy status to sulfonamides status: Secondary | ICD-10-CM | POA: Diagnosis not present

## 2019-05-12 LAB — CBC
HCT: 37.3 % (ref 36.0–46.0)
Hemoglobin: 12 g/dL (ref 12.0–15.0)
MCH: 29.1 pg (ref 26.0–34.0)
MCHC: 32.2 g/dL (ref 30.0–36.0)
MCV: 90.5 fL (ref 80.0–100.0)
Platelets: 289 10*3/uL (ref 150–400)
RBC: 4.12 MIL/uL (ref 3.87–5.11)
RDW: 12.7 % (ref 11.5–15.5)
WBC: 13.4 10*3/uL — ABNORMAL HIGH (ref 4.0–10.5)
nRBC: 0 % (ref 0.0–0.2)

## 2019-05-12 LAB — BASIC METABOLIC PANEL
Anion gap: 10 (ref 5–15)
BUN: 9 mg/dL (ref 6–20)
CO2: 19 mmol/L — ABNORMAL LOW (ref 22–32)
Calcium: 8.4 mg/dL — ABNORMAL LOW (ref 8.9–10.3)
Chloride: 110 mmol/L (ref 98–111)
Creatinine, Ser: 0.76 mg/dL (ref 0.44–1.00)
GFR calc Af Amer: >60 mL/min (ref >60)
GFR calc non Af Amer: >60 mL/min (ref >60)
Glucose, Bld: 95 mg/dL (ref 70–99)
Potassium: 4.8 mmol/L (ref 3.5–5.1)
Sodium: 139 mmol/L (ref 135–145)

## 2019-05-12 MED ORDER — OXYCODONE HCL 5 MG PO TABS: 5.0000 mg | ORAL_TABLET | ORAL | 0 refills | Status: DC | PRN

## 2019-05-12 MED ORDER — METHOCARBAMOL 500 MG PO TABS: 500.0000 mg | ORAL_TABLET | Freq: Four times a day (QID) | ORAL | Status: DC | PRN

## 2019-05-12 MED ORDER — METHOCARBAMOL 500 MG PO TABS: 500.0000 mg | ORAL_TABLET | Freq: Four times a day (QID) | ORAL | 0 refills | Status: DC | PRN

## 2019-05-12 MED ORDER — ASPIRIN 81 MG PO CHEW: 81.0000 mg | CHEWABLE_TABLET | Freq: Two times a day (BID) | ORAL | 0 refills | Status: DC

## 2019-05-12 NOTE — Discharge Instructions (Signed)

## 2019-05-12 NOTE — Progress Notes (Signed)
Pt stable at this time with no needs. No changes in pt overall condition. No questions on d/c instructions and education given. Pt was given dme prior to discharge. rn will continue to monitor until pt is discharged home with family.

## 2019-05-12 NOTE — Discharge Summary (Signed)
Patient ID: Janet May MRN: [id number] DOB/AGE: 1970/10/01 49 y.o.  Admit date: 05/11/2019 Discharge date: 05/12/2019  Admission Diagnoses:  Principal Problem:   Avascular necrosis of bone of hip, right ([place]) Active Problems:   Status post total replacement of right hip   Discharge Diagnoses:  Same  Past Medical History:  Diagnosis Date  . AVN (avascular necrosis of bone) ([place])    right hip  . GERD (gastroesophageal reflux disease)     Surgeries: Procedure(s): RIGHT TOTAL HIP ARTHROPLASTY ANTERIOR APPROACH on 05/11/2019   Consultants:   Discharged Condition: Improved  Hospital Course: Janet May is an 49 y.o. female who was admitted 05/11/2019 for operative treatment ofAvascular necrosis of bone of hip, right ([place]). Patient has severe unremitting pain that affects sleep, daily activities, and work/hobbies. After pre-op clearance the patient was taken to the operating room on 05/11/2019 and underwent  Procedure(s): RIGHT TOTAL HIP ARTHROPLASTY ANTERIOR APPROACH.    Patient was given perioperative antibiotics:  Anti-infectives (From admission, onward)   Start     Dose/Rate Route Frequency Ordered Stop   05/11/19 0630  ceFAZolin (ANCEF) IVPB 2g/100 mL premix     2 g 200 mL/hr over 30 Minutes Intravenous On call to O.R. 05/11/19 [id number] 05/11/19 0844       Patient was given sequential compression devices, early ambulation, and chemoprophylaxis to prevent DVT.  Patient benefited maximally from hospital stay and there were no complications.    Recent vital signs:  Patient Vitals for the past 24 hrs:  BP Temp Temp src Pulse Resp SpO2 Height Weight  05/12/19 0530 105/66 98 F (36.7 C) Oral 71 16 97 % -- --  05/12/19 0051 118/68 97.9 F (36.6 C) Oral 67 16 100 % -- --  05/11/19 2038 110/69 98.6 F (37 C) Oral 70 16 98 % -- --  05/11/19 1536 127/79 98 F (36.7 C) Oral 76 16 99 % -- --  05/11/19 1351 129/83 97.8 F (36.6 C) -- 76 16 100 % -- --  05/11/19 1240  118/84 97.7 F (36.5 C) -- 75 14 100 % -- --  05/11/19 1140 (!) 133/95 98 F (36.7 C) Oral 71 16 100 % 5\' 5"  (1.651 m) 81.6 kg  05/11/19 1115 132/84 (!) 97.5 F (36.4 C) -- 81 19 100 % -- --  05/11/19 1100 (!) 139/93 -- -- 80 14 100 % -- --  05/11/19 1045 125/86 -- -- 91 19 100 % -- --  05/11/19 1040 -- -- -- 89 20 99 % -- --  05/11/19 1037 (!) 133/93 -- -- 89 (!) 22 94 % -- --  05/11/19 1030 -- -- -- -- -- 92 % -- --  05/11/19 1015 135/87 (!) 97.5 F (36.4 C) -- (!) 109 15 100 % -- --     Recent laboratory studies:  Recent Labs    05/12/19 0746  WBC 13.4*  HGB 12.0  HCT 37.3  PLT 289  NA 139  K 4.8  CL 110  CO2 19*  BUN 9  CREATININE 0.76  GLUCOSE 95  CALCIUM 8.4*     Discharge Medications:   Allergies as of 05/12/2019      Reactions   Sulfa Antibiotics Hives      Medication List    TAKE these medications   APPLE CIDER VINEGAR PO Take 2 tablets by mouth daily.   aspirin 81 MG chewable tablet Chew 1 tablet (81 mg total) by mouth 2 (two) times daily.   [employer] FIBER  GUMMIES PO Take 1 tablet by mouth daily.   fluticasone 50 MCG/ACT nasal spray Commonly known as: FLONASE Place 2 sprays into both nostrils daily. What changed: when to take this   methocarbamol 500 MG tablet Commonly known as: Robaxin Take 1 tablet (500 mg total) by mouth every 6 (six) hours as needed for muscle spasms.   omeprazole 20 MG capsule Commonly known as: PRILOSEC Take 1 capsule (20 mg total) by mouth daily.   omeprazole 40 MG capsule Commonly known as: PRILOSEC Take 40 mg by mouth daily.   oxyCODONE 5 MG immediate release tablet Commonly known as: Oxy IR/ROXICODONE Take 1-2 tablets (5-10 mg total) by mouth every 4 (four) hours as needed for moderate pain (pain score 4-6).   phenylephrine 10 MG Tabs tablet Commonly known as: SUDAFED PE Take 10 mg by mouth at bedtime.   ROBITUSSIN CF PO Take by mouth as needed.            Durable Medical Equipment  (From admission,  onward)         Start     Ordered   05/11/19 1148  DME 3 n 1  Once     05/11/19 1147   05/11/19 1148  DME Walker rolling  Once    Question Answer Comment  Walker: With 5 Inch Wheels   Patient needs a walker to treat with the following condition Status post total replacement of right hip      05/11/19 1147          Diagnostic Studies: DG Pelvis Portable  Result Date: 05/11/2019 CLINICAL DATA:  Right hip replacement. EXAM: PORTABLE PELVIS 1-2 VIEWS COMPARISON:  Intraoperative x-rays from same day. Right hip x-rays dated March 26, 2019. FINDINGS: The right hip demonstrates a total arthroplasty without evidence of hardware failure or complication. There is no fracture or dislocation. The alignment is anatomic. Post-surgical changes noted in the surrounding soft tissues. IMPRESSION: Interval right total hip arthroplasty without acute postoperative complication. Electronically Signed   By: Titus Dubin M.D.   On: 05/11/2019 11:21   DG C-Arm 1-60 Min-No Report  Result Date: 05/11/2019 Fluoroscopy was utilized by the requesting physician.  No radiographic interpretation.   DG HIP OPERATIVE UNILAT W OR W/O PELVIS RIGHT  Result Date: 05/11/2019 CLINICAL DATA:  Right total hip arthroplasty. Avascular necrosis. EXAM: OPERATIVE RIGHT HIP (WITH PELVIS IF PERFORMED) 2 VIEWS TECHNIQUE: Fluoroscopic spot image(s) were submitted for interpretation post-operatively. COMPARISON:  Pelvic and right hip radiographs 03/26/2019. FINDINGS: C-arm fluoroscopy was provided in the operating room. 18 seconds of fluoro time. Two spot fluoroscopic images of the right hip and lower pelvis demonstrate interval right total hip arthroplasty without demonstrated complication. There is some gas within the soft tissue surrounding the right hip. IMPRESSION: Intraoperative views during right total hip arthroplasty. Electronically Signed   By: Richardean Sale M.D.   On: 05/11/2019 09:57    Disposition: Discharge  disposition: 01-Home or [place]    Mcarthur Rossetti, MD Follow up in 2 week(s).   Specialty: Orthopedic Surgery Contact information: [address] [place] [address] [phone]            Signed: Mcarthur Rossetti 05/12/2019, 9:50 AM

## 2019-05-12 NOTE — Progress Notes (Signed)
Subjective: 1 Day Post-Op Procedure(s) (LRB): RIGHT TOTAL HIP ARTHROPLASTY ANTERIOR APPROACH (Right) Patient reports pain as moderate.    Objective: Vital signs in last 24 hours: Temp:  [97.5 F (36.4 C)-98.6 F (37 C)] 98 F (36.7 C) (02/27 0530) Pulse Rate:  [67-109] 71 (02/27 0530) Resp:  [14-22] 16 (02/27 0530) BP: (105-139)/(66-95) 105/66 (02/27 0530) SpO2:  [92 %-100 %] 97 % (02/27 0530) Weight:  [81.6 kg] 81.6 kg (02/26 1140)  Intake/Output from previous day: 02/26 0701 - 02/27 0700 In: 3654.9 [P.O.:840; I.V.:2714.9; IV Piggyback:100] Out: 2100 [Urine:1900; Blood:200] Intake/Output this shift: Total I/O In: 689.4 [P.O.:600; I.V.:89.4] Out: -   Recent Labs    05/12/19 0746  HGB 12.0   Recent Labs    05/12/19 0746  WBC 13.4*  RBC 4.12  HCT 37.3  PLT 289   Recent Labs    05/12/19 0746  NA 139  K 4.8  CL 110  CO2 19*  BUN 9  CREATININE 0.76  GLUCOSE 95  CALCIUM 8.4*   No results for input(s): LABPT, INR in the last 72 hours.  Sensation intact distally Intact pulses distally Dorsiflexion/Plantar flexion intact Incision: dressing C/D/I   Assessment/Plan: 1 Day Post-Op Procedure(s) (LRB): RIGHT TOTAL HIP ARTHROPLASTY ANTERIOR APPROACH (Right) Up with therapy Discharge home with home health      Mcarthur Rossetti 05/12/2019, 9:49 AM

## 2019-05-12 NOTE — Plan of Care (Signed)
  Problem: Clinical Measurements: Goal: Respiratory complications will improve Outcome: Progressing   Problem: Clinical Measurements: Goal: Cardiovascular complication will be avoided Outcome: Progressing   Problem: Coping: Goal: Level of anxiety will decrease Outcome: Progressing   Problem: Elimination: Goal: Will not experience complications related to bowel motility Outcome: Progressing   Problem: Safety: Goal: Ability to remain free from injury will improve Outcome: Progressing   Problem: Education: Goal: Knowledge of the prescribed therapeutic regimen will improve Outcome: Progressing

## 2019-05-12 NOTE — TOC Progression Note (Signed)
Transition of Care [facility]) - Progression Note    Patient Details  Name: Janet May MRN: [id number] Date of Birth: September 06, 1970  Transition of [facility]) CM/SW Contact  Joaquin Courts, RN Phone Number: 05/12/2019, 12:04 PM  Clinical Narrative:   [facility] [place].  CM delivered RW and 3in1 to bedside.     Expected Discharge Plan: [place] Barriers to Discharge: No Barriers Identified  Expected Discharge Plan and Services Expected Discharge Plan: [place]   Discharge Planning Services: CM Consult Post Acute Care Choice: [place] arrangements for the past 2 months: Single Family Home Expected Discharge Date: 05/12/19               DME Arranged: Gilford Rile rolling, 3-N-1 DME Agency: [employer] Date DME Agency Contacted: 05/12/19 Time DME Agency Contacted: [phone] Representative spoke with at DME Agency: delivery made by CM, delivery ticket and referral info left for rep HH Arranged: PT [place]: [facility] (formerly [employer]) Date [place]: 05/12/19 Time [place]: 1204 Representative spoke with at [place]: [place] ([place]) Interventions    Readmission Risk Interventions No flowsheet data found.

## 2019-05-12 NOTE — Progress Notes (Signed)
Physical Therapy Treatment Patient Details Name: Janet May MRN: [id number] DOB: Jun 08, 1970 Today's Date: 05/12/2019    History of Present Illness Pt s/p R THR 2* AVN    PT Comments    Pt progressing well with mobility - ambulating increased distance and initiating reciprocal gait.  Reviewed car transfers.   Follow Up Recommendations  Home health PT;Follow surgeon's recommendation for DC plan and follow-up therapies     Equipment Recommendations  Rolling walker with 5" wheels    Recommendations for Other Services       Precautions / Restrictions Precautions Precautions: Fall Restrictions Weight Bearing Restrictions: No Other Position/Activity Restrictions: WBAT    Mobility  Bed Mobility Overal bed mobility: Needs Assistance Bed Mobility: Supine to Sit     Supine to sit: Min assist;Min guard     General bed mobility comments: Pt up in chair and requests back to same  Transfers Overall transfer level: Needs assistance Equipment used: Rolling walker (2 wheeled) Transfers: Sit to/from Stand Sit to Stand: Min guard;Supervision         General transfer comment: cues for LE management and use of UEs to self assist  Ambulation/Gait Ambulation/Gait assistance: Min guard;Supervision Gait Distance (Feet): 150 Feet Assistive device: Rolling walker (2 wheeled) Gait Pattern/deviations: Decreased step length - right;Decreased step length - left;Shuffle;Trunk flexed;Step-to pattern;Step-through pattern Gait velocity: decr   General Gait Details: cues for posture, position from RW and intial sequence   Stairs             Wheelchair Mobility    Modified Rankin (Stroke Patients Only)       Balance Overall balance assessment: Mild deficits observed, not formally tested                                          Cognition Arousal/Alertness: Awake/alert Behavior During Therapy: WFL for tasks assessed/performed Overall Cognitive  Status: Within Functional Limits for tasks assessed                                        Exercises Total Joint Exercises Ankle Circles/Pumps: AROM;Both;15 reps;Supine Quad Sets: AROM;Both;10 reps;Supine Heel Slides: AAROM;Right;20 reps;Supine Hip ABduction/ADduction: AAROM;Right;15 reps;Supine    General Comments        Pertinent Vitals/Pain Pain Assessment: 0-10 Pain Score: 3  Pain Location: R hip Pain Descriptors / Indicators: Aching;Sore Pain Intervention(s): Limited activity within patient's tolerance;Monitored during session;Premedicated before session;Ice applied    Home Living                      Prior Function            PT Goals (current goals can now be found in the care plan section) Acute Rehab PT Goals Patient Stated Goal: Regain IND PT Goal Formulation: With patient Time For Goal Achievement: 05/18/19 Potential to Achieve Goals: Good Progress towards PT goals: Progressing toward goals    Frequency    7X/week      PT Plan Current plan remains appropriate    Co-evaluation              AM-PAC PT "6 Clicks" Mobility   Outcome Measure  Help needed turning from your back to your side while in a flat bed without using bedrails?: A Little Help needed  moving from lying on your back to sitting on the side of a flat bed without using bedrails?: A Little Help needed moving to and from a bed to a chair (including a wheelchair)?: A Little Help needed standing up from a chair using your arms (e.g., wheelchair or bedside chair)?: A Little Help needed to walk in hospital room?: A Little Help needed climbing 3-5 steps with a railing? : A Little 6 Click Score: 18    End of Session Equipment Utilized During Treatment: Gait belt Activity Tolerance: Patient tolerated treatment well Patient left: in chair;with call bell/phone within reach;with chair alarm set Nurse Communication: Mobility status PT Visit Diagnosis: Difficulty in  walking, not elsewhere classified (R26.2)     Time: [id number] PT Time Calculation (min) (ACUTE ONLY): 21 min  Charges:  $Gait Training: 8-22 mins $Therapeutic Exercise: 8-22 mins                     [place] Pager [phone] Office [phone]    Maryanna Stuber 05/12/2019, 12:48 PM

## 2019-05-13 DIAGNOSIS — Z96641 Presence of right artificial hip joint: Secondary | ICD-10-CM | POA: Diagnosis not present

## 2019-05-13 DIAGNOSIS — Z471 Aftercare following joint replacement surgery: Secondary | ICD-10-CM | POA: Diagnosis not present

## 2019-05-14 ENCOUNTER — Encounter: Payer: Self-pay | Admitting: *Deleted

## 2019-05-14 ENCOUNTER — Telehealth: Payer: Self-pay | Admitting: Orthopaedic Surgery

## 2019-05-14 NOTE — Telephone Encounter (Signed)
Verbal order given to Sonia Side

## 2019-05-14 NOTE — Telephone Encounter (Signed)
C[facility] with [facility] called inquiring about PT verbal orders for the pt; she would like to do 3 times a week for 4 weeks.  [phone]

## 2019-05-15 DIAGNOSIS — Z96641 Presence of right artificial hip joint: Secondary | ICD-10-CM | POA: Diagnosis not present

## 2019-05-15 DIAGNOSIS — Z471 Aftercare following joint replacement surgery: Secondary | ICD-10-CM | POA: Diagnosis not present

## 2019-05-17 DIAGNOSIS — Z471 Aftercare following joint replacement surgery: Secondary | ICD-10-CM | POA: Diagnosis not present

## 2019-05-17 DIAGNOSIS — Z96641 Presence of right artificial hip joint: Secondary | ICD-10-CM | POA: Diagnosis not present

## 2019-05-22 DIAGNOSIS — Z471 Aftercare following joint replacement surgery: Secondary | ICD-10-CM | POA: Diagnosis not present

## 2019-05-22 DIAGNOSIS — Z96641 Presence of right artificial hip joint: Secondary | ICD-10-CM | POA: Diagnosis not present

## 2019-05-23 DIAGNOSIS — Z471 Aftercare following joint replacement surgery: Secondary | ICD-10-CM | POA: Diagnosis not present

## 2019-05-23 DIAGNOSIS — Z96641 Presence of right artificial hip joint: Secondary | ICD-10-CM | POA: Diagnosis not present

## 2019-05-24 ENCOUNTER — Encounter: Payer: Self-pay | Admitting: Orthopaedic Surgery

## 2019-05-24 ENCOUNTER — Ambulatory Visit (INDEPENDENT_AMBULATORY_CARE_PROVIDER_SITE_OTHER): Payer: 59 | Admitting: Orthopaedic Surgery

## 2019-05-24 ENCOUNTER — Other Ambulatory Visit: Payer: Self-pay

## 2019-05-24 DIAGNOSIS — Z96641 Presence of right artificial hip joint: Secondary | ICD-10-CM

## 2019-05-24 NOTE — Progress Notes (Signed)
The patient is a very pleasant 49 year old female who is now 13 days status post a right total hip arthroplasty due to avascular necrosis.  She is ambulate with just a cane and still in physical therapy.  She reports that she is doing very well overall.  She is only taking an aspirin twice a day.  I did let her know she can go down to once a day for a week and then can stop her aspirin.  She did have a lot of questions for [place] today which were all appropriate as to what she can expect and activities that she can get back to.  Her incision looks good overall.  The staples been removed and Steri-Strips applied.  Her leg lengths are equal.  She appears very comfortable.  Since she is not taking any narcotics she can drop from my standpoint.  She states that she works from home and is going to go back to work next week.  She does work in the [employer].  All question concerns were answered and addressed.  We will see her back in 4 weeks to see how she is doing overall but no x-rays are needed.

## 2019-05-25 ENCOUNTER — Telehealth: Payer: Self-pay | Admitting: Orthopaedic Surgery

## 2019-05-25 DIAGNOSIS — Z96641 Presence of right artificial hip joint: Secondary | ICD-10-CM | POA: Diagnosis not present

## 2019-05-25 DIAGNOSIS — Z471 Aftercare following joint replacement surgery: Secondary | ICD-10-CM | POA: Diagnosis not present

## 2019-05-25 NOTE — Telephone Encounter (Signed)
Patient called on FMLA paperwork it has her returning 08/05/19 but she was told she could return on 05/28/19. This must be corrected per her Employer before she comes back.  Please call patient to advise.  [phone]

## 2019-05-25 NOTE — Telephone Encounter (Signed)
Patient would like return to work note for 05/28/2019 (or 05/29/2019 if we are unable to provide today).  OK for note?  Also, patient would like to know if she needs to continue physical therapy since she is able to return to work. Please advise.

## 2019-05-25 NOTE — Telephone Encounter (Signed)
[place] for work note.  She can stop PT.

## 2019-05-28 DIAGNOSIS — Z96641 Presence of right artificial hip joint: Secondary | ICD-10-CM | POA: Diagnosis not present

## 2019-05-28 DIAGNOSIS — Z471 Aftercare following joint replacement surgery: Secondary | ICD-10-CM | POA: Diagnosis not present

## 2019-05-28 NOTE — Telephone Encounter (Signed)
Patient will call back and let me know if she's coming to pick it up or a fax number

## 2019-06-08 ENCOUNTER — Telehealth: Payer: Self-pay | Admitting: Orthopaedic Surgery

## 2019-06-08 NOTE — Telephone Encounter (Signed)
FYI

## 2019-06-08 NOTE — Telephone Encounter (Signed)
Received call from Verdia Kuba (PT) with [facility] stating she is reporting a missed visit with patient today. The number to contact Anderson [place] is [phone]

## 2019-06-21 ENCOUNTER — Ambulatory Visit (INDEPENDENT_AMBULATORY_CARE_PROVIDER_SITE_OTHER): Payer: 59 | Admitting: Orthopaedic Surgery

## 2019-06-21 ENCOUNTER — Encounter: Payer: Self-pay | Admitting: Orthopaedic Surgery

## 2019-06-21 ENCOUNTER — Other Ambulatory Visit: Payer: Self-pay

## 2019-06-21 DIAGNOSIS — Z96641 Presence of right artificial hip joint: Secondary | ICD-10-CM

## 2019-06-21 NOTE — Progress Notes (Signed)
[place] will be 6 weeks tomorrow status post a right total hip arthroplasty to treat advanced necrosis.  She has some soreness and numbness and she is walking every day but overall she says things are going great.  She is not walking with any assistive device.  She does not have a limp.  She is driving as well.  She does work from home within the [employer].  On examination she has excellent range of motion of her operative right hip.  Her leg lengths are equal.  This point she can place any kind of scar creams or lotion she would like to on her incision.  We had a long thorough discussion about her hip.  All questions and concerns were answered and addressed.  From my standpoint we do not need to see her back for 6 months unless she is having any issues.  At that visit a like a standing low AP pelvis and a lateral of her right operative hip.

## 2019-07-04 ENCOUNTER — Ambulatory Visit: Payer: 59 | Admitting: Obstetrics and Gynecology

## 2019-07-20 ENCOUNTER — Ambulatory Visit: Payer: 59 | Admitting: Obstetrics and Gynecology

## 2019-07-20 DIAGNOSIS — Z0289 Encounter for other administrative examinations: Secondary | ICD-10-CM

## 2019-07-20 MED FILL — FLUTICASONE PROP 50 MCG SPR: 50 | 30 days supply | Qty: 16 | Fill #2

## 2019-07-30 ENCOUNTER — Other Ambulatory Visit: Payer: Self-pay

## 2019-07-31 ENCOUNTER — Encounter: Payer: Self-pay | Admitting: Obstetrics and Gynecology

## 2019-07-31 ENCOUNTER — Ambulatory Visit (INDEPENDENT_AMBULATORY_CARE_PROVIDER_SITE_OTHER): Payer: 59 | Admitting: Obstetrics and Gynecology

## 2019-07-31 DIAGNOSIS — N72 Inflammatory disease of cervix uteri: Secondary | ICD-10-CM | POA: Diagnosis not present

## 2019-07-31 DIAGNOSIS — Z760 Encounter for issue of repeat prescription: Secondary | ICD-10-CM | POA: Diagnosis not present

## 2019-07-31 DIAGNOSIS — R87612 Low grade squamous intraepithelial lesion on cytologic smear of cervix (LGSIL): Secondary | ICD-10-CM | POA: Diagnosis not present

## 2019-07-31 NOTE — Addendum Note (Signed)
Addended by: Joseph Pierini D on: 07/31/2019 04:24 PM   Modules accepted: Orders

## 2019-07-31 NOTE — Progress Notes (Signed)
   Janet May 26-May-1970 [id number]  SUBJECTIVE:  49 y.o. 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  She says she not recall having an abnormal Pap smear in the past.  She did start her menstrual period yesterday and has only had light bleeding to this point.   OBJECTIVE:  BP 116/76   LMP 07/30/2019    Procedure note Colposcopy exam  Physical Exam Genitourinary:        The cervix was cleansed with a dilute acetic acid solution.  The cervix was closely inspected as well as the vaginal sidewalls and fornices and there were no acetowhite changes, or any other abnormalities noted.  The patient was experiencing light menses and the menstrual blood was able to be wiped away, but the transformation zone was not able to be visualized, partly due to the menses, but also partly due to the a narrow external os.  A cervical os finder was used to gently dilate the external cervical os but this resulted in expulsion of more menstrual blood.  An endocervical curettage was obtained with a Kevorkian curette.  Examination was inadequate.  The patient tolerated the procedure well.  Chaperone: Caryn Bee present during the examination and procedure  ASSESSMENT:  49 y.o. [id number] here for colposcopy examination  PLAN:  We discussed the role of HPV mediated changes leading to abnormal cervical tissue and cervical dysplasia.  We discussed the procedure and the purpose of performing today in response to the abnormal Pap smear.  There were no abnormal findings on today's examination, but the exam was inadequate.  We will follow up the results of the ECC and let her know how to best proceed.  Joseph Pierini MD 07/31/19

## 2019-08-02 LAB — TISSUE SPECIMEN

## 2019-08-02 LAB — PATHOLOGY REPORT

## 2019-08-10 MED FILL — OMEPRAZOLE 40 MG CPDR: 40 | 90 days supply | Qty: 90 | Fill #0

## 2019-08-29 ENCOUNTER — Ambulatory Visit (HOSPITAL_BASED_OUTPATIENT_CLINIC_OR_DEPARTMENT_OTHER)
Admission: RE | Admit: 2019-08-29 | Discharge: 2019-08-29 | Disposition: A | Discharge status: Home / Self Care | Payer: 59 | Source: Ambulatory Visit | Attending: Obstetrics and Gynecology | Admitting: Obstetrics and Gynecology

## 2019-08-29 ENCOUNTER — Other Ambulatory Visit: Payer: Self-pay

## 2019-08-29 ENCOUNTER — Other Ambulatory Visit (HOSPITAL_BASED_OUTPATIENT_CLINIC_OR_DEPARTMENT_OTHER): Payer: Self-pay | Admitting: Obstetrics and Gynecology

## 2019-08-29 DIAGNOSIS — Z1231 Encounter for screening mammogram for malignant neoplasm of breast: Secondary | ICD-10-CM | POA: Insufficient documentation

## 2019-10-03 ENCOUNTER — Telehealth: Payer: 59 | Admitting: Family

## 2019-10-03 DIAGNOSIS — K649 Unspecified hemorrhoids: Secondary | ICD-10-CM

## 2019-10-03 MED ORDER — HYDROCORTISONE ACETATE 25 MG RE SUPP: 25.0000 mg | Freq: Two times a day (BID) | RECTAL | 0 refills | Status: DC

## 2019-10-03 MED FILL — HYDROCORTISONE AC 25 MG SUP: 25 | 6 days supply | Qty: 12 | Fill #0

## 2019-10-03 NOTE — Progress Notes (Signed)
E-Visit for Hemorrhoid  We are sorry that you are not feeling well. We are here to help!  Hemorrhoids are swollen veins in the rectum. They can cause itching, bleeding, and pain. Hemorrhoids are very common.  In some cases, you can see or feel hemorrhoids around the outside of the rectum. In other cases, you cannot see them because they are hidden inside the rectum. Be patient - It can take months for this to improve or go away.   Hemorrhoids do not always cause symptoms. But when they do, symptoms can include: ?Itching of the skin around the anus ?Bleeding - Bleeding is usually painless. You might see bright red blood after using the toilet. ?Pain - If a blood clot forms inside a hemorrhoid, this can cause pain. It can also cause a lump that you might be able to feel.   What can I do to keep from getting more hemorrhoids? -- The most important thing you can do is to keep from getting constipated. You should have a bowel movement at least a few times a week. When you have a bowel movement, you also should not have to push too much. Plus, your bowel movements should not be too hard. Being constipated and having hard bowel movements can make hemorrhoids worse.   I have prescribed Anusol HC suppositories.  Insert into rectum twice per day for 6 days.   Some times with hemorrhoids blood is common, I would continue to monitor it and if it does not resolve or becomes worse you will need to be seen face to face.   HOME CARE: . Sitz Baths twice daily. Soak buttocks in 2 or 3 inches of warm water for 10 to 15 minutes. Do not add soap, bubble bath, or anything to the water. . Stool softener such as Colace 100 mg twice daily AND Miralax 1 scoop daily until you have regular soft stools . Over the counter Preparation H . Tucks Pads . Witch Hazel  Here are some steps you can take to avoid getting constipated or having hard stools:  ?Eat lots of fruits, vegetables, and other foods with fiber. Fiber  helps to increase bowel movements. If you do not get enough fiber from your diet, you can take fiber supplements. These come in the form of powders, wafers, or pills. Some examples are Metamucil, Citrucel, Benefiber and FiberCon. If you take a fiber supplement, be sure to read the label so you know how much to take. If you're not sure, ask your provider or nurse. ?Take medicines called "stool softeners" such as docusate sodium (sample brand names: Colace, Dulcolax). These medicines increase the number of bowel movements you have. They are safe to take and they can prevent problems later.  You should request a referral for a follow up evaluation with a Gastroenterologist (GI doctor) to evaluate this chronic and relapsing condition - even if it improves to see what further steps need to be taken. This is highly linked to chronic constipation and straining to have a bowel movement. It may require further treatment or surgical intervention.   GET HELP RIGHT AWAY IF: . You develop severe pain . You have heavy bleeding   FOLLOW UP WITH YOUR PRIMARY PROVIDER IF: . If your symptoms do not improve within 10 days  MAKE SURE YOU   Understand these instructions.  Will watch your condition.  Will get help right away if you are not doing well or get worse.  Your e-visit answers were reviewed by  a board certified advanced clinical practitioner to complete your personal care plan. Depending upon the condition, your plan could have included both over the counter or prescription medications.  Your safety is important to [place]. If you have drug allergies check your prescription carefully.   You can use MyChart to ask questions about today's visit, request a non-urgent call back, or ask for a work or school excuse for 24 hours related to this e-Visit. If it has been greater than 24 hours you will need to follow up with your provider, or enter a new e-Visit to address those concerns.  You will get an e-mail with a  link to a survey asking about your experience.  We hope that your e-visit has been valuable and will speed your recovery! Thank you for using e-visits.    Approximately 5 minutes was spent documenting and reviewing patient's chart.

## 2019-11-06 ENCOUNTER — Other Ambulatory Visit: Payer: Self-pay | Admitting: Family Medicine

## 2019-11-06 MED ORDER — OMEPRAZOLE 40 MG PO CPDR: 40.0000 mg | DELAYED_RELEASE_CAPSULE | Freq: Every day | ORAL | 0 refills | Status: DC

## 2019-11-06 MED FILL — FLUTICASONE PROP 50 MCG SPR: 50 | 30 days supply | Qty: 16 | Fill #3

## 2019-11-06 MED FILL — OMEPRAZOLE 40 MG CPDR: 40 | 90 days supply | Qty: 90 | Fill #0

## 2019-11-06 NOTE — Telephone Encounter (Signed)
Refill omeprazole

## 2019-11-23 ENCOUNTER — Encounter: Payer: Self-pay | Admitting: Family Medicine

## 2019-11-23 ENCOUNTER — Other Ambulatory Visit: Payer: Self-pay

## 2019-11-23 ENCOUNTER — Ambulatory Visit (INDEPENDENT_AMBULATORY_CARE_PROVIDER_SITE_OTHER): Payer: 59 | Admitting: Family Medicine

## 2019-11-23 VITALS — BP 122/80 | HR 83 | Temp 98.5°F | Ht 65.0 in | Wt 183.0 lb

## 2019-11-23 DIAGNOSIS — Z1159 Encounter for screening for other viral diseases: Secondary | ICD-10-CM | POA: Diagnosis not present

## 2019-11-23 DIAGNOSIS — Z0000 Encounter for general adult medical examination without abnormal findings: Secondary | ICD-10-CM | POA: Diagnosis not present

## 2019-11-23 NOTE — Progress Notes (Signed)
Chief Complaint  Patient presents with  . Annual Exam     Well Woman Janet May is here for a complete physical.   Her last physical was >1 year ago.  Current diet: in general, a "healthy" diet. Current exercise: walking. Weight is stable and she denies fatigue out of ordinary. Seatbelt? Yes  Health Maintenance Pap/HPV- Yes Mammogram- Yes Tetanus- Yes Hep C screening- No HIV screening- Yes  Past Medical History:  Diagnosis Date  . AVN (avascular necrosis of bone) (HCC)    right hip  . GERD (gastroesophageal reflux disease)      Past Surgical History:  Procedure Laterality Date  . CESAREAN SECTION  2001  . CESAREAN SECTION  2002  . TOTAL HIP ARTHROPLASTY Right 05/11/2019   Procedure: RIGHT TOTAL HIP ARTHROPLASTY ANTERIOR APPROACH;  Surgeon: Mcarthur Rossetti, MD;  Location: [facility];  Service: Orthopedics;  Laterality: Right;  . TUBAL LIGATION  2005    Medications  Current Outpatient Medications on File Prior to Visit  Medication Sig Dispense Refill  . APPLE CIDER VINEGAR PO Take 2 tablets by mouth daily.    Marland Kitchen omeprazole (PRILOSEC) 40 MG capsule Take 1 capsule (40 mg total) by mouth daily. 90 capsule 0   Allergies Allergies  Allergen Reactions  . Sulfa Antibiotics Hives    Review of Systems: Constitutional:  no unexpected weight changes Eye:  no recent significant change in vision Ear/Nose/Mouth/Throat:  Ears:  no recent change in hearing Nose/Mouth/Throat:  no complaints of nasal congestion, no sore throat Cardiovascular: no chest pain Respiratory:  no shortness of breath Gastrointestinal:  no abdominal pain, no change in bowel habits GU:  Female: negative for dysuria or pelvic pain Musculoskeletal/Extremities:  no pain of the joints Integumentary (Skin/Breast):  no abnormal skin lesions reported Neurologic:  no headaches Endocrine:  denies fatigue Hematologic/Lymphatic:  No areas of easy bleeding  Exam BP 122/80 (BP Location: Left Arm,  Patient Position: Sitting, Cuff Size: Normal)   Pulse 83   Temp 98.5 F (36.9 C) (Oral)   Ht 5\' 5"  (1.651 m)   Wt 183 lb (83 kg)   SpO2 98%   BMI 30.45 kg/m  General:  well developed, well nourished, in no apparent distress Skin:  no significant moles, warts, or growths Head:  no masses, lesions, or tenderness Eyes:  pupils equal and round, sclera anicteric without injection Ears:  canals without lesions, TMs shiny without retraction, no obvious effusion, no erythema Nose:  nares patent, septum midline, mucosa normal, and no drainage or sinus tenderness Throat/Pharynx:  lips and gingiva without lesion; tongue and uvula midline; non-inflamed pharynx; no exudates or postnasal drainage Neck: neck supple without adenopathy, thyromegaly, or masses Lungs:  clear to auscultation, breath sounds equal bilaterally, no respiratory distress Cardio:  regular rate and rhythm, no LE edema Abdomen:  abdomen soft, nontender; bowel sounds normal; no masses or organomegaly Genital: Defer to GYN Musculoskeletal:  symmetrical muscle groups noted without atrophy or deformity Extremities:  no clubbing, cyanosis, or edema, no deformities, no skin discoloration Neuro:  gait normal; deep tendon reflexes normal and symmetric Psych: well oriented with normal range of affect and appropriate judgment/insight  Assessment and Plan  Well adult exam - Plan: CBC, Comprehensive metabolic panel, Lipid panel  Encounter for hepatitis C screening test for low risk patient - Plan: Hepatitis C antibody   Well 49 y.o. female. Counseled on diet and exercise. Other orders as above. Follow up in 1 yr or prn. The patient voiced understanding  and agreement to the plan.  [place], DO 11/23/19 1:29 PM

## 2019-11-23 NOTE — Patient Instructions (Addendum)
Give [place] 2-3 business days to get the results of your labs back.   Keep the diet clean and stay active.  Aim to do some physical exertion for 150 minutes per week. This is typically divided into 5 days per week, 30 minutes per day. The activity should be enough to get your heart rate up. Anything is better than nothing if you have time constraints.  I recommend getting the flu shot in mid October. This suggestion would change if the CDC comes out with a different recommendation.   The only lifestyle changes that have data behind them are weight loss for the overweight/obese and elevating the head of the bed. Finding out which foods/positions are triggers is important.  Try stopping omeprazole. You may need to transition to Pepcid (famotidine) 20 mg daily.   Let us know if you need anything.  Healthy Eating Plan Many factors influence your heart health, including eating and exercise habits. Heart (coronary) risk increases with abnormal blood fat (lipid) levels. Heart-healthy meal planning includes limiting unhealthy fats, increasing healthy fats, and making other small dietary changes. This includes maintaining a healthy body weight to help keep lipid levels within a normal range.  WHAT IS MY PLAN?  Your health care provider recommends that you:  Drink a glass of water before meals to help with satiety.  Eat slowly.  An alternative to the water is to add Metamucil. This will help with satiety as well. It does contain calories, unlike water.  WHAT TYPES OF FAT SHOULD I CHOOSE?  Choose healthy fats more often. Choose monounsaturated and polyunsaturated fats, such as olive oil and canola oil, flaxseeds, walnuts, almonds, and seeds.  Eat more omega-3 fats. Good choices include salmon, mackerel, sardines, tuna, flaxseed oil, and ground flaxseeds. Aim to eat fish at least two times each week.  Avoid foods with partially hydrogenated oils in them. These contain trans fats. Examples of foods  that contain trans fats are stick margarine, some tub margarines, cookies, crackers, and other baked goods. If you are going to avoid a fat, this is the one to avoid!  WHAT GENERAL GUIDELINES DO I NEED TO FOLLOW?  Check food labels carefully to identify foods with trans fats. Avoid these types of options when possible.  Fill one half of your plate with vegetables and green salads. Eat 4-5 servings of vegetables per day. A serving of vegetables equals 1 cup of raw leafy vegetables,  cup of raw or cooked cut-up vegetables, or  cup of vegetable juice.  Fill one fourth of your plate with whole grains. Look for the word "whole" as the first word in the ingredient list.  Fill one fourth of your plate with lean protein foods.  Eat 4-5 servings of fruit per day. A serving of fruit equals one medium whole fruit,  cup of dried fruit,  cup of fresh, frozen, or canned fruit. Try to avoid fruits in cups/syrups as the sugar content can be high.  Eat more foods that contain soluble fiber. Examples of foods that contain this type of fiber are apples, broccoli, carrots, beans, peas, and barley. Aim to get 20-30 g of fiber per day.  Eat more home-cooked food and less restaurant, buffet, and fast food.  Limit or avoid alcohol.  Limit foods that are high in starch and sugar.  Avoid fried foods when able.  Cook foods by using methods other than frying. Baking, boiling, grilling, and broiling are all great options. Other fat-reducing suggestions include: ? Removing the  skin from poultry. ? Removing all visible fats from meats. ? Skimming the fat off of stews, soups, and gravies before serving them. ? Steaming vegetables in water or broth.  Lose weight if you are overweight. Losing just 5-10% of your initial body weight can help your overall health and prevent diseases such as diabetes and heart disease.  Increase your consumption of nuts, legumes, and seeds to 4-5 servings per week. One serving of  dried beans or legumes equals  cup after being cooked, one serving of nuts equals 1 ounces, and one serving of seeds equals  ounce or 1 tablespoon.  WHAT ARE GOOD FOODS CAN I EAT? Grains Grainy breads (try to find bread that is 3 g of fiber per slice or greater), oatmeal, light popcorn. Whole-grain cereals. Rice and pasta, including brown rice and those that are made with whole wheat. Edamame pasta is a great alternative to grain pasta. It has a higher protein content. Try to avoid significant consumption of white bread, sugary cereals, or pastries/baked goods.  Vegetables All vegetables. Cooked white potatoes do not count as vegetables.  Fruits All fruits, but limit pineapple and bananas as these fruits have a higher sugar content.  Meats and Other Protein Sources Lean, well-trimmed beef, veal, pork, and lamb. Chicken and [place] without skin. All fish and shellfish. Wild duck, rabbit, pheasant, and venison. Egg whites or low-cholesterol egg substitutes. Dried beans, peas, lentils, and tofu.Seeds and most nuts.  Dairy Low-fat or nonfat cheeses, including ricotta, string, and mozzarella. Skim or 1% milk that is liquid, powdered, or evaporated. Buttermilk that is made with low-fat milk. Nonfat or low-fat yogurt. Soy/Almond milk are good alternatives if you cannot handle dairy.  Beverages Water is the best for you. Sports drinks with less sugar are more desirable unless you are a highly active athlete.  Sweets and Desserts Sherbets and fruit ices. Honey, jam, marmalade, jelly, and syrups. Dark chocolate.  Eat all sweets and desserts in moderation.  Fats and Oils Nonhydrogenated (trans-free) margarines. Vegetable oils, including soybean, sesame, sunflower, olive, peanut, safflower, corn, canola, and cottonseed. Salad dressings or mayonnaise that are made with a vegetable oil. Limit added fats and oils that you use for cooking, baking, salads, and as spreads.  Other Cocoa powder. Coffee  and tea. Most condiments.  The items listed above may not be a complete list of recommended foods or beverages. Contact your dietitian for more options.

## 2019-11-26 ENCOUNTER — Other Ambulatory Visit: Payer: Self-pay | Admitting: Family Medicine

## 2019-11-26 DIAGNOSIS — E785 Hyperlipidemia, unspecified: Secondary | ICD-10-CM

## 2019-11-26 LAB — COMPREHENSIVE METABOLIC PANEL
AG Ratio: 1.3 (calc) (ref 1.0–2.5)
ALT: 13 U/L (ref 6–29)
AST: 13 U/L (ref 10–35)
Albumin: 4.1 g/dL (ref 3.6–5.1)
Alkaline phosphatase (APISO): 58 U/L (ref 31–125)
BUN: 8 mg/dL (ref 7–25)
CO2: 22 mmol/L (ref 20–32)
Calcium: 9.2 mg/dL (ref 8.6–10.2)
Chloride: 102 mmol/L (ref 98–110)
Creat: 0.82 mg/dL (ref 0.50–1.10)
Globulin: 3.2 g/dL (calc) (ref 1.9–3.7)
Glucose, Bld: 83 mg/dL (ref 65–99)
Potassium: 4.4 mmol/L (ref 3.5–5.3)
Sodium: 137 mmol/L (ref 135–146)
Total Bilirubin: 0.3 mg/dL (ref 0.2–1.2)
Total Protein: 7.3 g/dL (ref 6.1–8.1)

## 2019-11-26 LAB — LIPID PANEL
Cholesterol: 209 mg/dL — ABNORMAL HIGH (ref <200)
HDL: 54 mg/dL (ref >=50)
LDL Cholesterol (Calc): 130 mg/dL (calc) — ABNORMAL HIGH
Non-HDL Cholesterol (Calc): 155 mg/dL (calc) — ABNORMAL HIGH (ref <130)
Total CHOL/HDL Ratio: 3.9 (calc) (ref <5.0)
Triglycerides: 134 mg/dL (ref <150)

## 2019-11-26 LAB — CBC
HCT: 38.3 % (ref 35.0–45.0)
Hemoglobin: 12.4 g/dL (ref 11.7–15.5)
MCH: 28.4 pg (ref 27.0–33.0)
MCHC: 32.4 g/dL (ref 32.0–36.0)
MCV: 87.8 fL (ref 80.0–100.0)
MPV: 10.1 fL (ref 7.5–12.5)
Platelets: 327 10*3/uL (ref 140–400)
RBC: 4.36 10*6/uL (ref 3.80–5.10)
RDW: 13.1 % (ref 11.0–15.0)
WBC: 7.2 10*3/uL (ref 3.8–10.8)

## 2019-11-26 LAB — HEPATITIS C ANTIBODY
Hepatitis C Ab: NONREACTIVE
SIGNAL TO CUT-OFF: 0.01 (ref <1.00)

## 2019-12-20 ENCOUNTER — Telehealth: Payer: 59 | Admitting: Physician Assistant

## 2019-12-20 ENCOUNTER — Other Ambulatory Visit: Payer: Self-pay | Admitting: Physician Assistant

## 2019-12-20 DIAGNOSIS — J0110 Acute frontal sinusitis, unspecified: Secondary | ICD-10-CM | POA: Diagnosis not present

## 2019-12-20 MED ORDER — AMOXICILLIN-POT CLAVULANATE 875-125 MG PO TABS: 1.0000 | ORAL_TABLET | Freq: Two times a day (BID) | ORAL | 0 refills | Status: DC

## 2019-12-20 NOTE — Progress Notes (Signed)
We are sorry that you are not feeling well.  Here is how we plan to help! ° °Based on what you have shared with me it looks like you have sinusitis.  Sinusitis is inflammation and infection in the sinus cavities of the head.  Based on your presentation I believe you most likely have Acute Bacterial Sinusitis.  This is an infection caused by bacteria and is treated with antibiotics. I have prescribed Augmentin 875mg/125mg one tablet twice daily with food, for 7 days. You may use an oral decongestant such as Mucinex D or if you have glaucoma or high blood pressure use plain Mucinex. Saline nasal spray help and can safely be used as often as needed for congestion.  If you develop worsening sinus pain, fever or notice severe headache and vision changes, or if symptoms are not better after completion of antibiotic, please schedule an appointment with a health care provider.   ° °Sinus infections are not as easily transmitted as other respiratory infection, however we still recommend that you avoid close contact with loved ones, especially the very young and elderly.  Remember to wash your hands thoroughly throughout the day as this is the number one way to prevent the spread of infection! ° °Home Care: °· Only take medications as instructed by your medical team. °· Complete the entire course of an antibiotic. °· Do not take these medications with alcohol. °· A steam or ultrasonic humidifier can help congestion.  You can place a towel over your head and breathe in the steam from hot water coming from a faucet. °· Avoid close contacts especially the very young and the elderly. °· Cover your mouth when you cough or sneeze. °· Always remember to wash your hands. ° °Get Help Right Away If: °· You develop worsening fever or sinus pain. °· You develop a severe head ache or visual changes. °· Your symptoms persist after you have completed your treatment plan. ° °Make sure you °· Understand these instructions. °· Will watch your  condition. °· Will get help right away if you are not doing well or get worse. ° °Your e-visit answers were reviewed by a board certified advanced clinical practitioner to complete your personal care plan.  Depending on the condition, your plan could have included both over the counter or prescription medications. ° °If there is a problem please reply  once you have received a response from your provider. ° °Your safety is important to us.  If you have drug allergies check your prescription carefully.   ° °You can use MyChart to ask questions about today’s visit, request a non-urgent call back, or ask for a work or school excuse for 24 hours related to this e-Visit. If it has been greater than 24 hours you will need to follow up with your provider, or enter a new e-Visit to address those concerns. ° °You will get an e-mail in the next two days asking about your experience.  I hope that your e-visit has been valuable and will speed your recovery. Thank you for using e-visits. ° °Greater than 5 minutes, yet less than 10 minutes of time have been spent researching, coordinating, and implementing care for this patient today. ° °Magdalina Whitehead PA-C ° ° °

## 2019-12-21 MED FILL — AMOX-CLAV 875-125 MG TABLET: 875-125 | 7 days supply | Qty: 14 | Fill #0

## 2019-12-24 ENCOUNTER — Ambulatory Visit (INDEPENDENT_AMBULATORY_CARE_PROVIDER_SITE_OTHER): Payer: 59 | Admitting: Orthopaedic Surgery

## 2019-12-24 ENCOUNTER — Ambulatory Visit: Payer: Self-pay

## 2019-12-24 ENCOUNTER — Encounter: Payer: Self-pay | Admitting: Orthopaedic Surgery

## 2019-12-24 DIAGNOSIS — Z96641 Presence of right artificial hip joint: Secondary | ICD-10-CM

## 2019-12-24 NOTE — Progress Notes (Signed)
Office Visit Note   Patient: Janet May           Date of Birth: 02/13/1971           MRN: [id number] Visit Date: 12/24/2019              Requested by: [name], [address] [place],  [place] [address] PCP: [name], DO   Assessment & Plan: Visit Diagnoses:  1. Status post total replacement of right hip     Plan: At this point since the patient is doing so well, follow-up can be as needed.  All questions and concerns were answered and addressed.  Her previous MRI did not show any evidence of avascular process of the left hip.  This was only the right hip.  She continues to deny any issues with either hip so at this point follow-up can be as needed.  She understands that if there is any issues at all she will come back and see [place].  Follow-Up Instructions: Return if symptoms worsen or fail to improve.   Orders:  Orders Placed This Encounter  Procedures   XR HIP UNILAT W OR W/O PELVIS 1V RIGHT   No orders of the defined types were placed in this encounter.     Procedures: No procedures performed   Clinical Data: No additional findings.   Subjective: Chief Complaint  Patient presents with   Right Hip - Follow-up  Patient is now 8 months status post a right total hip arthroplasty to treat right hip avascular necrosis.  She states that she is doing great and has no issues at all.  She denies any left hip pain.  HPI  Review of Systems There is currently no listed headache, chest pain, shortness of breath, fever, chills, nausea, vomiting  Objective: Vital Signs: There were no vitals taken for this visit.  Physical Exam She is alert and orient x3 and in no acute distress Ortho Exam Examination of her right operative hip shows it moves smoothly and fluidly with no issues at all.  Her left hip also moves smoothly and fluidly. Specialty Comments:  No specialty comments available.  Imaging: XR HIP UNILAT W OR  W/O PELVIS 1V RIGHT  Result Date: 12/24/2019 An AP pelvis lateral right hip shows a well-seated total hip arthroplasty of the right side with no complicating features.  The left hip on the AP view appears normal.    PMFS History: Patient Active Problem List   Diagnosis Date Noted   Status post total replacement of right hip 05/11/2019   Avascular necrosis of bone of hip, right ([place]) 02/22/2018   Past Medical History:  Diagnosis Date   AVN (avascular necrosis of bone) (HCC)    right hip   GERD (gastroesophageal reflux disease)     Family History  Problem Relation Age of Onset   Diabetes Mother    Hypertension Mother    Cancer Father 40       lung cancer    Past Surgical History:  Procedure Laterality Date   CESAREAN SECTION  2001   CESAREAN SECTION  2002   TOTAL HIP ARTHROPLASTY Right 05/11/2019   Procedure: RIGHT TOTAL HIP ARTHROPLASTY ANTERIOR APPROACH;  Surgeon: Mcarthur Rossetti, MD;  Location: [facility];  Service: Orthopedics;  Laterality: Right;   TUBAL LIGATION  2005   Social History   Occupational History   Not on file  Tobacco Use   Smoking status: Never  Smoker   Smokeless tobacco: Never Used  Vaping Use   Vaping Use: Never used  Substance and Sexual Activity   Alcohol use: Yes    Comment: Occas wine   Drug use: No   Sexual activity: Yes    Birth control/protection: Surgical    Comment: 1st intercourse 49 yo-More than 5 partner-BTL

## 2020-01-30 ENCOUNTER — Other Ambulatory Visit: Payer: Self-pay | Admitting: Family Medicine

## 2020-01-30 MED FILL — OMEPRAZOLE 40 MG CPDR: 40 | 90 days supply | Qty: 90 | Fill #0

## 2020-02-25 ENCOUNTER — Other Ambulatory Visit (INDEPENDENT_AMBULATORY_CARE_PROVIDER_SITE_OTHER): Payer: 59

## 2020-02-25 ENCOUNTER — Other Ambulatory Visit: Payer: Self-pay | Admitting: Family Medicine

## 2020-02-25 ENCOUNTER — Other Ambulatory Visit: Payer: Self-pay

## 2020-02-25 DIAGNOSIS — E785 Hyperlipidemia, unspecified: Secondary | ICD-10-CM

## 2020-02-25 LAB — LIPID PANEL
Cholesterol: 210 mg/dL — ABNORMAL HIGH (ref 0–200)
HDL: 45.6 mg/dL (ref >39.00)
NonHDL: 164.46
Total CHOL/HDL Ratio: 5
Triglycerides: 317 mg/dL — ABNORMAL HIGH (ref 0.0–149.0)
VLDL: 63.4 mg/dL — ABNORMAL HIGH (ref 0.0–40.0)

## 2020-02-25 LAB — LDL CHOLESTEROL, DIRECT: Direct LDL: 124 mg/dL

## 2020-02-25 NOTE — Progress Notes (Signed)
Lipid panel 

## 2020-02-25 NOTE — Addendum Note (Signed)
Addended by: Kelle Darting A on: 02/25/2020 07:56 AM   Modules accepted: Orders

## 2020-03-01 IMAGING — DX DG PORTABLE PELVIS
1 series · 1 of 1 positions shown · non-contrast
Comparison: Intraoperative x-rays from same day. Right hip x-rays
dated March 26, 2019.

CLINICAL DATA: Right hip replacement.

EXAM:
PORTABLE PELVIS 1-2 VIEWS

[pelvis ap]
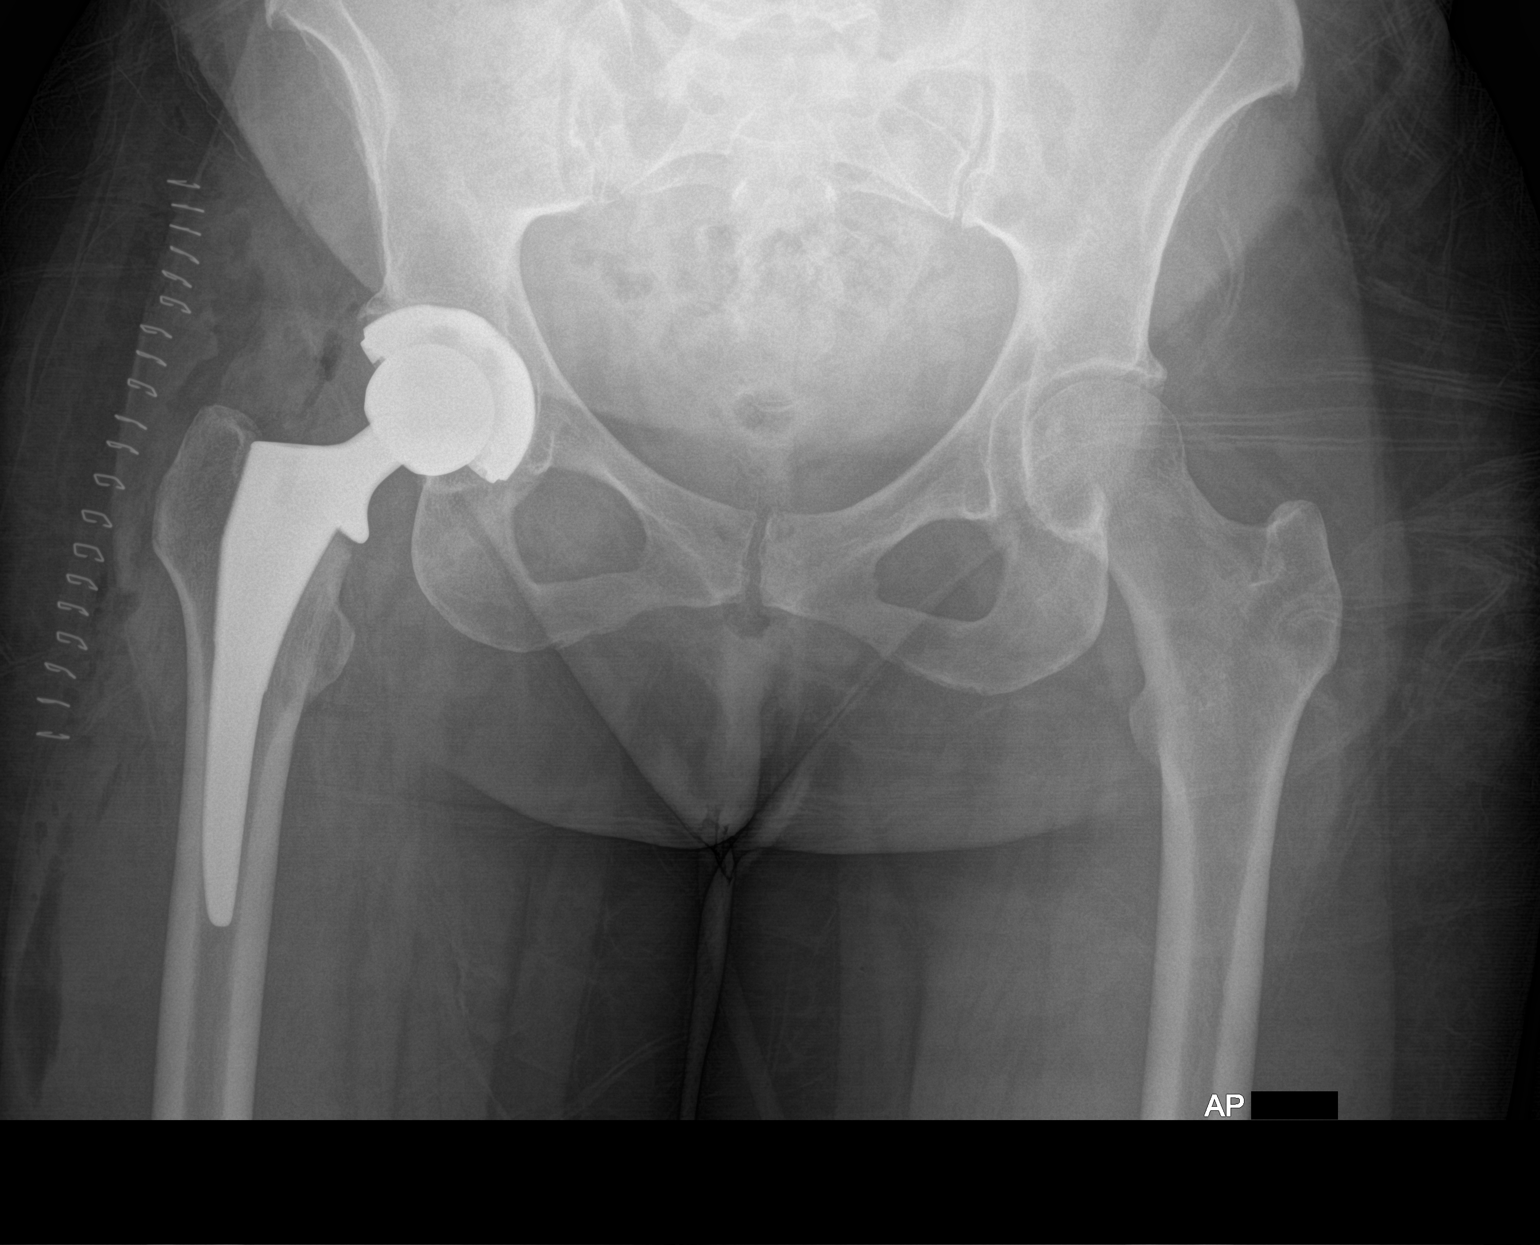

[1 of 1 positions shown; findings below may reference images not displayed]

FINDINGS: The right hip demonstrates a total arthroplasty without evidence of
hardware failure or complication. There is no fracture or
dislocation. The alignment is anatomic. Post-surgical changes noted
in the surrounding soft tissues.
IMPRESSION: Interval right total hip arthroplasty without acute postoperative
complication.

## 2020-03-01 IMAGING — RF DG HIP (WITH PELVIS) OPERATIVE*R*
1 series · 2 of 2 positions shown · non-contrast
Comparison: Pelvic and right hip radiographs 03/26/2019.

CLINICAL DATA: Right total hip arthroplasty. Avascular necrosis.

EXAM:
OPERATIVE RIGHT HIP (WITH PELVIS IF PERFORMED) 2 VIEWS
TECHNIQUE: Fluoroscopic spot image(s) were submitted for interpretation
post-operatively.

[Series 1: unknown protocol · 0.20mm/px · 2 of 2 slices shown]
[im 1/2]
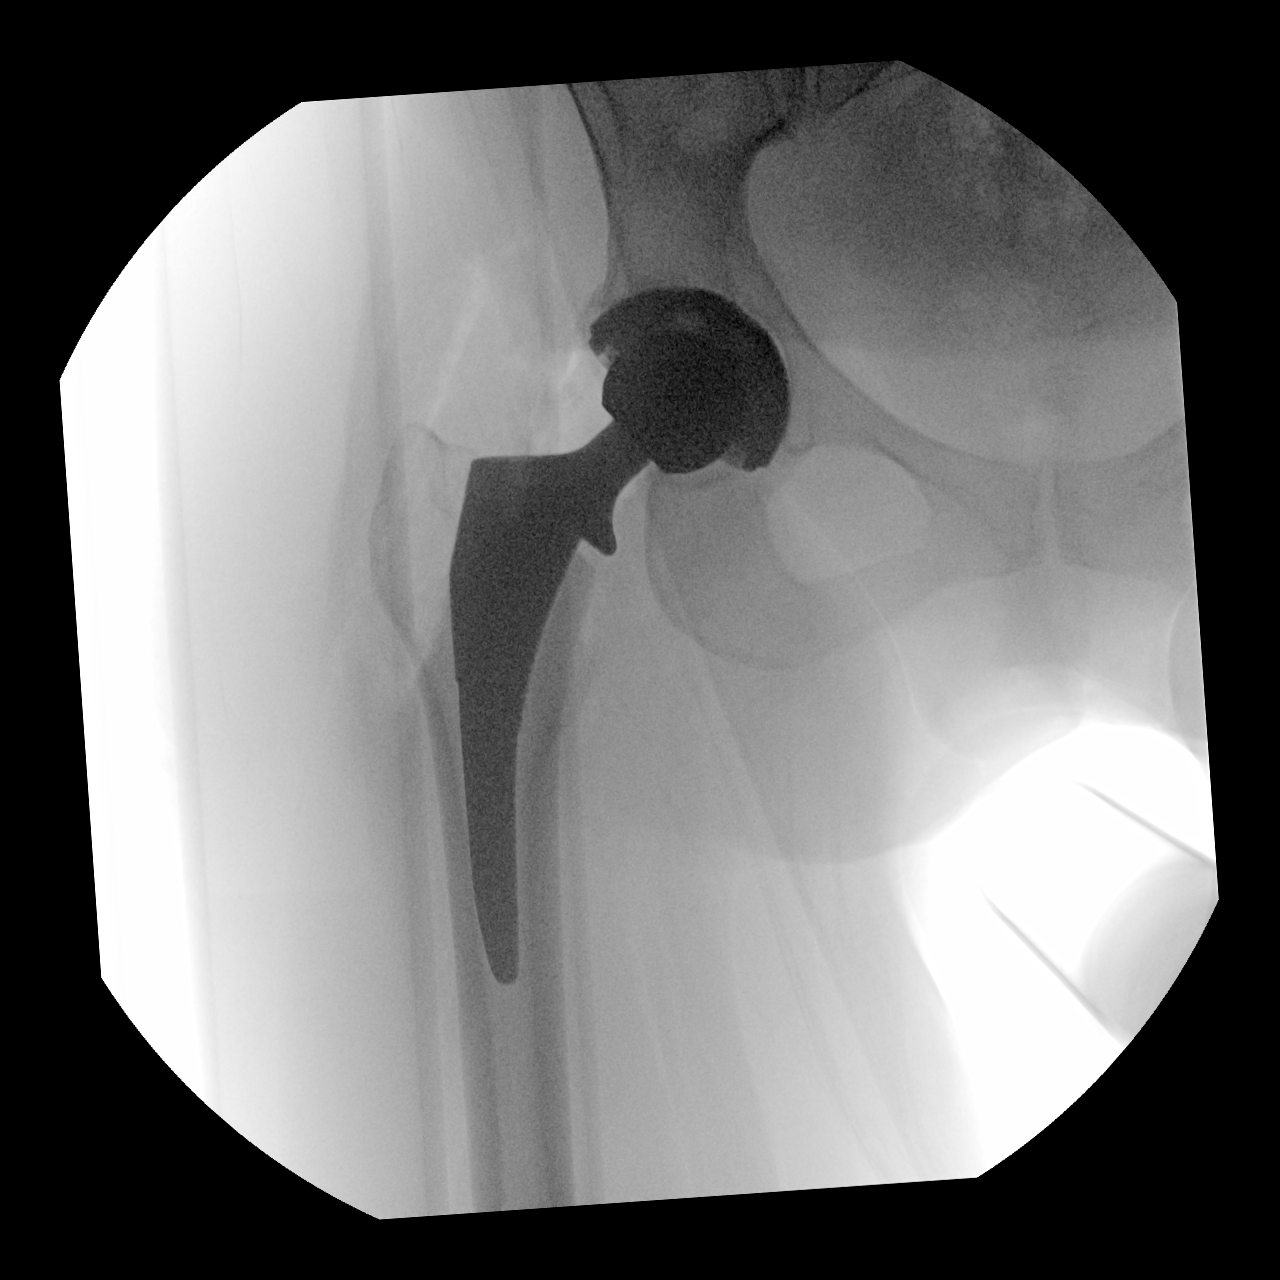
[im 2/2]
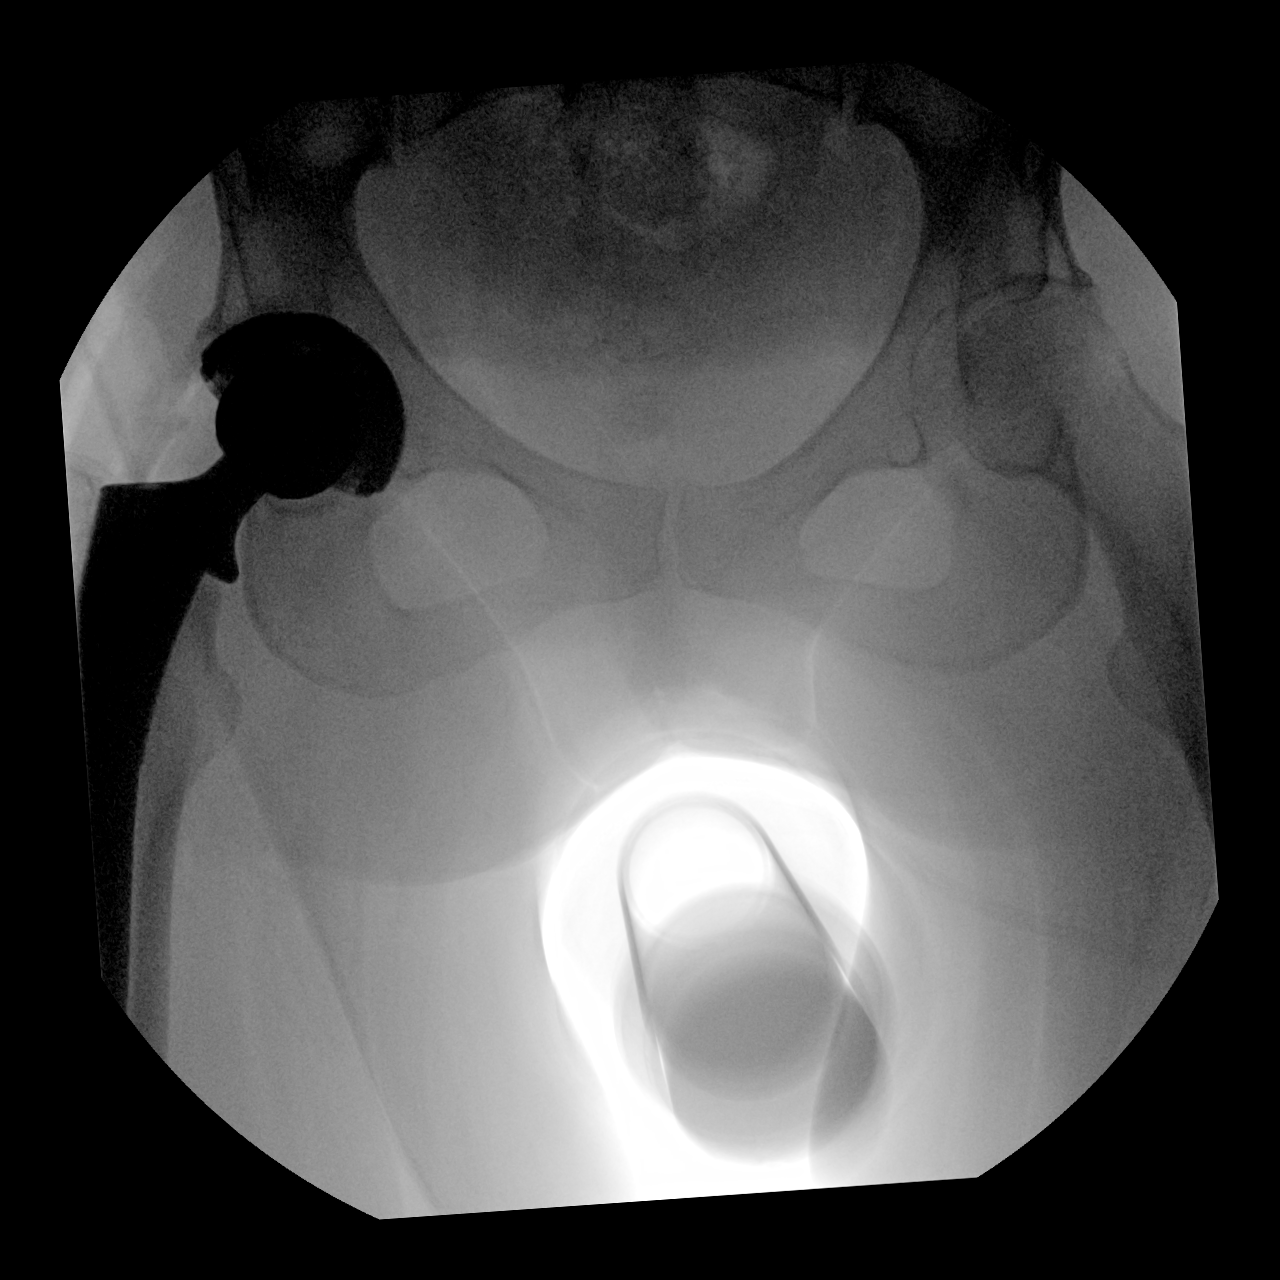

[2 of 2 positions shown; findings below may reference images not displayed]

FINDINGS: C-arm fluoroscopy was provided in the operating [HOSPITAL] seconds of
fluoro time.

Two spot fluoroscopic images of the right hip and lower pelvis
demonstrate interval right total hip arthroplasty without
demonstrated complication. There is some gas within the soft tissue
surrounding the right hip.
IMPRESSION: Intraoperative views during right total hip arthroplasty.

## 2020-03-13 ENCOUNTER — Telehealth: Payer: 59 | Admitting: Physician Assistant

## 2020-03-13 DIAGNOSIS — J029 Acute pharyngitis, unspecified: Secondary | ICD-10-CM

## 2020-03-13 NOTE — Progress Notes (Signed)
We are sorry that you are not feeling well.  Here is how we plan to help!  Your symptoms indicate a likely viral infection (Pharyngitis).   Pharyngitis is inflammation in the back of the throat which can cause a sore throat, scratchiness and sometimes difficulty swallowing.   Pharyngitis is typically caused by a respiratory virus and will just run its course.  Please keep in mind that your symptoms could last up to 10 days.    For throat pain, we recommend over the counter oral pain relief medications such as acetaminophen or aspirin, or anti-inflammatory medications such as ibuprofen or naproxen sodium.  Topical treatments such as oral throat lozenges or sprays may be used as needed.  Avoid close contact with loved ones, especially the very young and elderly.  Remember to wash your hands thoroughly throughout the day as this is the number one way to prevent the spread of infection and wipe down door knobs and counters with disinfectant.  After careful review of your answers, I would not recommend and antibiotic for your condition.  Antibiotics should not be used to treat conditions that we suspect are caused by viruses like the virus that causes the common cold or flu. However, some people can have Strep with atypical symptoms. You may need formal testing in clinic or office to confirm if your symptoms continue or worsen.  Providers prescribe antibiotics to treat infections caused by bacteria. Antibiotics are very powerful in treating bacterial infections when they are used properly.  To maintain their effectiveness, they should be used only when necessary.  Overuse of antibiotics has resulted in the development of super bugs that are resistant to treatment!    Home Care:  Only take medications as instructed by your medical team.  Do not drink alcohol while taking these medications.  A steam or ultrasonic humidifier can help congestion.  You can place a towel over your head and breathe in the steam  from hot water coming from a faucet.  Avoid close contacts especially the very young and the elderly.  Cover your mouth when you cough or sneeze.  Always remember to wash your hands.  Get Help Right Away If:  You develop worsening fever or throat pain.  You develop a severe head ache or visual changes.  Your symptoms persist after you have completed your treatment plan.  Make sure you  Understand these instructions.  Will watch your condition.  Will get help right away if you are not doing well or get worse.  Your e-visit answers were reviewed by a board certified advanced clinical practitioner to complete your personal care plan.  Depending on the condition, your plan could have included both over the counter or prescription medications.  If there is a problem please reply  once you have received a response from your provider.  Your safety is important to [place].  If you have drug allergies check your prescription carefully.    You can use MyChart to ask questions about todays visit, request a non-urgent call back, or ask for a work or school excuse for 24 hours related to this e-Visit. If it has been greater than 24 hours you will need to follow up with your provider, or enter a new e-Visit to address those concerns.  You will get an e-mail in the next two days asking about your experience.  I hope that your e-visit has been valuable and will speed your recovery. Thank you for using e-visits.  Greater than 5  minutes, yet less than 10 minutes of time have been spent researching, coordinating, and implementing care for this patient today.  Inda Coke PA-C

## 2020-04-29 ENCOUNTER — Other Ambulatory Visit: Payer: Self-pay | Admitting: Family Medicine

## 2020-04-29 MED FILL — OMEPRAZOLE 40 MG CPDR: 40 | 90 days supply | Qty: 90 | Fill #0

## 2020-06-03 ENCOUNTER — Other Ambulatory Visit: Payer: Self-pay | Admitting: Family Medicine

## 2020-06-03 MED FILL — FLUTICASONE PROP 50 MCG SPR: 50 | 30 days supply | Qty: 16 | Fill #0

## 2020-06-11 DIAGNOSIS — Z1211 Encounter for screening for malignant neoplasm of colon: Secondary | ICD-10-CM | POA: Diagnosis not present

## 2020-06-11 DIAGNOSIS — Z6832 Body mass index (BMI) 32.0-32.9, adult: Secondary | ICD-10-CM | POA: Diagnosis not present

## 2020-06-11 DIAGNOSIS — Z01419 Encounter for gynecological examination (general) (routine) without abnormal findings: Secondary | ICD-10-CM | POA: Diagnosis not present

## 2020-06-24 ENCOUNTER — Other Ambulatory Visit: Payer: Self-pay

## 2020-06-24 ENCOUNTER — Ambulatory Visit (INDEPENDENT_AMBULATORY_CARE_PROVIDER_SITE_OTHER): Payer: 59 | Admitting: Otolaryngology

## 2020-06-24 ENCOUNTER — Encounter (INDEPENDENT_AMBULATORY_CARE_PROVIDER_SITE_OTHER): Payer: Self-pay | Admitting: Otolaryngology

## 2020-06-24 VITALS — Temp 97.0°F

## 2020-06-24 DIAGNOSIS — J310 Chronic rhinitis: Secondary | ICD-10-CM

## 2020-06-24 DIAGNOSIS — H9313 Tinnitus, bilateral: Secondary | ICD-10-CM

## 2020-06-24 NOTE — Progress Notes (Signed)
HPI: Janet May is a 50 y.o. female who presents for evaluation of complaints of ringing or tinnitus that began initially in the right ear.  This is been on and off for about 6 months now.  She does have history of allergies and uses Flonase.  More recently since using the Flonase the ringing in the ear is gotten better.  She also has history of GERD and takes omeprazole daily in the mornings. She was at the ER several years ago and was told that she had eustachian tube dysfunction. She denies any hearing problems.  Denies any loud noise exposure.  Past Medical History:  Diagnosis Date  . AVN (avascular necrosis of bone) ([facility])    right hip  . GERD (gastroesophageal reflux disease)    Past Surgical History:  Procedure Laterality Date  . CESAREAN SECTION  2001  . CESAREAN SECTION  2002  . TOTAL HIP ARTHROPLASTY Right 05/11/2019   Procedure: RIGHT TOTAL HIP ARTHROPLASTY ANTERIOR APPROACH;  Surgeon: Mcarthur Rossetti, MD;  Location: [facility];  Service: Orthopedics;  Laterality: Right;  . TUBAL LIGATION  2005   Social History   Socioeconomic History  . Marital status: Single    Spouse name: Not on file  . Number of children: Not on file  . Years of education: Not on file  . Highest education level: Not on file  Occupational History  . Not on file  Tobacco Use  . Smoking status: Never Smoker  . Smokeless tobacco: Never Used  Vaping Use  . Vaping Use: Never used  Substance and Sexual Activity  . Alcohol use: Yes    Comment: Occas wine  . Drug use: No  . Sexual activity: Yes    Birth control/protection: Surgical    Comment: 1st intercourse 50 yo-More than 5 partner-BTL  Other Topics Concern  . Not on file  Social History Narrative  . Not on file   Social Determinants of Health   Financial Resource Strain: Not on file  Food Insecurity: Not on file  Transportation Needs: Not on file  Physical Activity: Not on file  Stress: Not on file  Social Connections: Not on  file   Family History  Problem Relation Age of Onset  . Diabetes Mother   . Hypertension Mother   . Cancer Father 68       lung cancer   Allergies  Allergen Reactions  . Sulfa Antibiotics Hives   Prior to Admission medications   Medication Sig Start Date End Date Taking? Authorizing Provider  amoxicillin-clavulanate (AUGMENTIN) 875-125 MG tablet TAKE 1 TABLET BY MOUTH 2 (TWO) TIMES DAILY FOR 7 DAYS. 12/20/19 12/19/20  Inda Coke, PA  APPLE CIDER VINEGAR PO Take 2 tablets by mouth daily.    [provider]  fluticasone (FLONASE) 50 MCG/ACT nasal spray PLACE 2 SPRAYS INTO BOTH NOSTRILS DAILY. 06/03/20 06/03/21  [name], DO  omeprazole (PRILOSEC) 40 MG capsule TAKE 1 CAPSULE (40 MG TOTAL) BY MOUTH DAILY. 04/29/20 04/29/21  [name], DO     Positive ROS: Otherwise negative  All other systems have been reviewed and were otherwise negative with the exception of those mentioned in the HPI and as above.  Physical Exam: Constitutional: Alert, well-appearing, no acute distress Ears: External ears without lesions or tenderness. Ear canals are clear bilaterally.  Both TMs are clear with good mobility on pneumatic otoscopy. Nasal: External nose without lesions. Septum relatively midline with moderate rhinitis.  She has a large bilateral inferior turbinates.  No polyps noted.  Both middle meatus regions are clear with no signs of infection.. Oral: Lips and gums without lesions. Tongue and palate mucosa without lesions. Posterior oropharynx clear.  Tonsils are average size bilaterally with no exudate.  No mucosal abnormalities noted. Neck: No palpable adenopathy or masses Respiratory: Breathing comfortably  Skin: No facial/neck lesions or rash noted.  Audiologic testing demonstrated normal hearing in both ears with SRT's of 10 DB on the right and 5 dB on the left.  She had type A tympanograms bilaterally.  Procedures  Assessment: Intermittent tinnitus  appears to be related to allergies and nasal congestion. Normal hearing evaluation on audiologic testing. Chronic rhinitis.  Plan: Reviewed with her concerning regular use of Flonase since she does better when using this. Concerning the tinnitus reviewed the audiogram with her today.  No evidence of hearing damage or hearing problems. Discussed with her concerning using masking noise when the tinnitus is bad.  Also cautioned about using ear protection when around loud noise. She will follow-up as needed.  Radene Journey, MD

## 2020-06-27 ENCOUNTER — Encounter (INDEPENDENT_AMBULATORY_CARE_PROVIDER_SITE_OTHER): Payer: Self-pay

## 2020-07-14 ENCOUNTER — Other Ambulatory Visit: Payer: Self-pay | Admitting: Family Medicine

## 2020-07-14 MED FILL — Fluticasone Propionate Nasal Susp 50 MCG/ACT: NASAL | 30 days supply | Qty: 16 | Fill #0 | Status: AC

## 2020-07-15 ENCOUNTER — Other Ambulatory Visit (HOSPITAL_BASED_OUTPATIENT_CLINIC_OR_DEPARTMENT_OTHER): Payer: Self-pay

## 2020-07-15 MED ORDER — OMEPRAZOLE 40 MG PO CPDR
DELAYED_RELEASE_CAPSULE | Freq: Every day | ORAL | 0 refills | Status: DC
  Filled 2020-07-15 – 2020-07-16 (×2): qty 90, 90d supply, fill #0

## 2020-07-16 ENCOUNTER — Other Ambulatory Visit (HOSPITAL_BASED_OUTPATIENT_CLINIC_OR_DEPARTMENT_OTHER): Payer: Self-pay

## 2020-07-16 ENCOUNTER — Other Ambulatory Visit: Payer: Self-pay | Admitting: Orthopaedic Surgery

## 2020-07-21 ENCOUNTER — Other Ambulatory Visit: Payer: Self-pay

## 2020-07-21 ENCOUNTER — Encounter: Payer: Self-pay | Admitting: Orthopaedic Surgery

## 2020-07-21 ENCOUNTER — Ambulatory Visit (INDEPENDENT_AMBULATORY_CARE_PROVIDER_SITE_OTHER): Payer: 59

## 2020-07-21 ENCOUNTER — Other Ambulatory Visit (HOSPITAL_BASED_OUTPATIENT_CLINIC_OR_DEPARTMENT_OTHER): Payer: Self-pay

## 2020-07-21 ENCOUNTER — Ambulatory Visit (INDEPENDENT_AMBULATORY_CARE_PROVIDER_SITE_OTHER): Payer: 59 | Admitting: Orthopaedic Surgery

## 2020-07-21 DIAGNOSIS — M5431 Sciatica, right side: Secondary | ICD-10-CM | POA: Diagnosis not present

## 2020-07-21 DIAGNOSIS — M79604 Pain in right leg: Secondary | ICD-10-CM

## 2020-07-21 MED ORDER — GABAPENTIN 100 MG PO CAPS
100.0000 mg | ORAL_CAPSULE | Freq: Three times a day (TID) | ORAL | 1 refills | Status: DC | PRN
  Filled 2020-07-21: qty 60, 20d supply, fill #0

## 2020-07-21 MED ORDER — METHYLPREDNISOLONE 4 MG PO TBPK
ORAL_TABLET | ORAL | 0 refills | Status: DC
  Filled 2020-07-21: qty 21, 6d supply, fill #0

## 2020-07-21 NOTE — Progress Notes (Signed)
Office Visit Note   Patient: Janet May           Date of Birth: 10-21-1970           MRN: [id number] Visit Date: 07/21/2020              Requested by: [name], [address] [place],  [place] [id number] PCP: [name], DO   Assessment & Plan: Visit Diagnoses:  1. Pain in right leg   2. Sciatica, right side     Plan: Given the extent of her sciatic symptoms I would like to place her on a 6-day steroid taper as well as some Neurontin.  I would also like to send her to outpatient physical therapy for any modalities that can help decrease her sciatica including lumbar traction and extension exercises.  This will be the per the therapist discretion.  Hopefully with this medications and therapy we can get her sliding him symptoms to improve.  If not, a MRI would be warranted.  I will see her back in 4 weeks to see how she is doing overall.  If things worsen before then she will let us know.  All questions and concerns were answered and addressed.  Follow-Up Instructions: Return in about 4 weeks (around 08/18/2020).   Orders:  Orders Placed This Encounter  Procedures  . XR Lumbar Spine 2-3 Views   Meds ordered this encounter  Medications  . methylPREDNISolone (MEDROL) 4 MG tablet    Sig: Medrol dose pack. Take as instructed    Dispense:  21 tablet    Refill:  0  . gabapentin (NEURONTIN) 100 MG capsule    Sig: Take 1 capsule (100 mg total) by mouth 3 (three) times daily as needed.    Dispense:  60 capsule    Refill:  1      Procedures: No procedures performed   Clinical Data: No additional findings.   Subjective: Chief Complaint  Patient presents with  . Right Leg - Pain  The patient is a very pleasant 50 year old female comes in for evaluation treatment of right-sided sciatica.  She has pain in the sciatic region on her right side with low back pain and does radiate to the back of her leg and down to her foot with  numbness and tingling.  This is been going on for several weeks now.  She is not a diabetic.  She denies any radicular symptoms on the left side.  Actually replaced her right hip in February 2021 and she said that is done well and there is no issues with the hip replacement.  She is had no other acute change in her medical status.  She denies any change in bowel or bladder function.  HPI  Review of Systems There is currently listed no headache, chest pain, shortness of breath, fever, chills, nausea, vomiting  Objective: Vital Signs: There were no vitals taken for this visit.  Physical Exam She is alert and oriented x3 and in no acute distress Ortho Exam Examination of her right hip is normal.  She does have a positive straight leg raise on the right side and significant pain in the sciatic region as well as numbness and tingling going down her right leg.  There is numbness in the right foot in the L5 distribution.   Specialty Comments:  No specialty comments available.  Imaging: XR Lumbar Spine 2-3 Views  Result Date: 07/21/2020 2 views of the lumbar  spine show significant degenerative changes between L4-L5 and L5-S1 with disc space narrowing and foraminal narrowing.  The levels above this appeared normal.  The alignment is well-maintained.    PMFS History: Patient Active Problem List   Diagnosis Date Noted  . 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 Avascular necrosis of bone of hip, right ([facility]) 02/22/2018   Past Medical History:  Diagnosis Date  . AVN (avascular necrosis of bone) ([facility])    right hip  . GERD (gastroesophageal reflux disease)     Family History  Problem Relation Age of Onset  . Diabetes Mother   . Hypertension Mother   . Cancer Father 20       lung cancer    Past Surgical History:  Procedure Laterality Date  . CESAREAN SECTION  2001  . CESAREAN SECTION  2002  . TOTAL HIP ARTHROPLASTY Right 05/11/2019   Procedure: RIGHT TOTAL HIP  ARTHROPLASTY ANTERIOR APPROACH;  Surgeon: Mcarthur Rossetti, MD;  Location: [facility];  Service: Orthopedics;  Laterality: Right;  . TUBAL LIGATION  2005   Social History   Occupational History  . Not on file  Tobacco Use  . Smoking status: Never Smoker  . Smokeless tobacco: Never Used  Vaping Use  . Vaping Use: Never used  Substance and Sexual Activity  . Alcohol use: Yes    Comment: Occas wine  . Drug use: No  . Sexual activity: Yes    Birth control/protection: Surgical    Comment: 1st intercourse 50 yo-More than 5 partner-BTL

## 2020-08-07 ENCOUNTER — Other Ambulatory Visit (HOSPITAL_BASED_OUTPATIENT_CLINIC_OR_DEPARTMENT_OTHER): Payer: Self-pay | Admitting: Obstetrics and Gynecology

## 2020-08-07 DIAGNOSIS — Z1231 Encounter for screening mammogram for malignant neoplasm of breast: Secondary | ICD-10-CM

## 2020-08-18 ENCOUNTER — Other Ambulatory Visit: Payer: Self-pay

## 2020-08-18 ENCOUNTER — Ambulatory Visit (INDEPENDENT_AMBULATORY_CARE_PROVIDER_SITE_OTHER): Payer: 59 | Admitting: Orthopaedic Surgery

## 2020-08-18 ENCOUNTER — Other Ambulatory Visit (HOSPITAL_BASED_OUTPATIENT_CLINIC_OR_DEPARTMENT_OTHER): Payer: Self-pay

## 2020-08-18 ENCOUNTER — Encounter: Payer: Self-pay | Admitting: Orthopaedic Surgery

## 2020-08-18 DIAGNOSIS — M5431 Sciatica, right side: Secondary | ICD-10-CM | POA: Diagnosis not present

## 2020-08-18 MED ORDER — NABUMETONE 750 MG PO TABS
750.0000 mg | ORAL_TABLET | Freq: Two times a day (BID) | ORAL | 2 refills | Status: DC | PRN
  Filled 2020-08-18: qty 30, 15d supply, fill #0

## 2020-08-18 NOTE — Progress Notes (Signed)
This is the third time I am seeing the patient today as it relates to significant low back pain with right-sided radicular symptoms and sciatica.  I first saw her in October for this so is now been 8 months.  She has worked on back extension exercises as well as activity modification.  I have had her on Neurontin as well as an anti-inflammatory which has been ibuprofen and we have had her try to get a therapy as well.  She has had a steroid taper twice.  The steroid is helped some but the radicular symptoms still occur with tingling and numbness and a burning pain going down her right lower extremity.  She is now diabetic.  She continues to have a lot of straight leg raise to the right side and subjective numbness in the L4 and L5 distribution.  Given the failed conservative treatment for right now of all modalities, a MRI is warranted to the lumbar spine to rule out nerve compression and to help guide our treatment modalities.  I will try Relafen as an anti-inflammatory instead of the ibuprofen and she will continue Robaxin and Neurontin.  We will see her back once we have the MRI of her lumbar spine.

## 2020-08-19 ENCOUNTER — Other Ambulatory Visit (HOSPITAL_BASED_OUTPATIENT_CLINIC_OR_DEPARTMENT_OTHER): Payer: Self-pay

## 2020-08-19 ENCOUNTER — Other Ambulatory Visit: Payer: Self-pay

## 2020-08-19 DIAGNOSIS — M4807 Spinal stenosis, lumbosacral region: Secondary | ICD-10-CM

## 2020-09-03 ENCOUNTER — Ambulatory Visit: Payer: 59 | Admitting: Physical Therapy

## 2020-09-08 ENCOUNTER — Ambulatory Visit (HOSPITAL_BASED_OUTPATIENT_CLINIC_OR_DEPARTMENT_OTHER): Payer: 59

## 2020-09-11 ENCOUNTER — Other Ambulatory Visit: Payer: Self-pay | Admitting: Orthopaedic Surgery

## 2020-09-11 ENCOUNTER — Ambulatory Visit: Payer: 59 | Admitting: Physical Therapy

## 2020-09-11 DIAGNOSIS — M4807 Spinal stenosis, lumbosacral region: Secondary | ICD-10-CM

## 2020-09-16 ENCOUNTER — Encounter (HOSPITAL_BASED_OUTPATIENT_CLINIC_OR_DEPARTMENT_OTHER): Payer: Self-pay

## 2020-09-16 ENCOUNTER — Other Ambulatory Visit: Payer: Self-pay

## 2020-09-16 ENCOUNTER — Ambulatory Visit (HOSPITAL_BASED_OUTPATIENT_CLINIC_OR_DEPARTMENT_OTHER)
Admission: RE | Admit: 2020-09-16 | Discharge: 2020-09-16 | Disposition: A | Discharge status: Home / Self Care | Payer: 59 | Source: Ambulatory Visit | Attending: Obstetrics and Gynecology | Admitting: Obstetrics and Gynecology

## 2020-09-16 DIAGNOSIS — Z1231 Encounter for screening mammogram for malignant neoplasm of breast: Secondary | ICD-10-CM | POA: Insufficient documentation

## 2020-09-17 ENCOUNTER — Other Ambulatory Visit: Payer: Self-pay

## 2020-09-17 ENCOUNTER — Ambulatory Visit (AMBULATORY_SURGERY_CENTER): Payer: 59

## 2020-09-17 ENCOUNTER — Other Ambulatory Visit (HOSPITAL_BASED_OUTPATIENT_CLINIC_OR_DEPARTMENT_OTHER): Payer: Self-pay

## 2020-09-17 VITALS — Ht 65.0 in | Wt 180.0 lb

## 2020-09-17 DIAGNOSIS — Z1211 Encounter for screening for malignant neoplasm of colon: Secondary | ICD-10-CM

## 2020-09-17 MED ORDER — NA SULFATE-K SULFATE-MG SULF 17.5-3.13-1.6 GM/177ML PO SOLN
1.0000 | ORAL | 0 refills | Status: DC
  Filled 2020-09-17: qty 354, 2d supply, fill #0

## 2020-09-17 NOTE — Progress Notes (Signed)
Patient's pre-visit was done today over the phone with the patient due to COVID-19 pandemic. Name,DOB and address verified. Insurance verified. Patient denies any allergies to Eggs and Soy. Patient denies any problems with anesthesia/sedation. Patient denies taking diet pills or blood thinners. Packet of Prep instructions mailed to patient including a copy of a consent form-pt is aware. Patient understands to call us back with any questions or concerns. Patient is aware of our care-partner policy and EQAST-41 safety protocol.   EMMI education assigned to the patient for the procedure, sent to [place].   The patient is COVID-19 vaccinated, per patient.

## 2020-09-22 ENCOUNTER — Other Ambulatory Visit (HOSPITAL_BASED_OUTPATIENT_CLINIC_OR_DEPARTMENT_OTHER): Payer: Self-pay

## 2020-09-22 MED FILL — Fluticasone Propionate Nasal Susp 50 MCG/ACT: NASAL | 30 days supply | Qty: 16 | Fill #1 | Status: AC

## 2020-09-23 ENCOUNTER — Encounter: Payer: Self-pay | Admitting: Orthopaedic Surgery

## 2020-09-29 ENCOUNTER — Other Ambulatory Visit: Payer: Self-pay

## 2020-09-29 ENCOUNTER — Encounter: Payer: Self-pay | Admitting: Gastroenterology

## 2020-09-29 ENCOUNTER — Ambulatory Visit (AMBULATORY_SURGERY_CENTER): Payer: 59 | Admitting: Gastroenterology

## 2020-09-29 ENCOUNTER — Encounter: Payer: Self-pay | Admitting: *Deleted

## 2020-09-29 VITALS — BP 128/73 | HR 79 | Temp 98.0°F | Resp 17 | Ht 65.0 in | Wt 180.0 lb

## 2020-09-29 DIAGNOSIS — K635 Polyp of colon: Secondary | ICD-10-CM

## 2020-09-29 DIAGNOSIS — Z1211 Encounter for screening for malignant neoplasm of colon: Secondary | ICD-10-CM

## 2020-09-29 DIAGNOSIS — D125 Benign neoplasm of sigmoid colon: Secondary | ICD-10-CM

## 2020-09-29 DIAGNOSIS — K219 Gastro-esophageal reflux disease without esophagitis: Secondary | ICD-10-CM | POA: Diagnosis not present

## 2020-09-29 MED ORDER — SODIUM CHLORIDE 0.9 % IV SOLN: 500.0000 mL | Freq: Once | INTRAVENOUS | Status: DC

## 2020-09-29 NOTE — Progress Notes (Signed)
CW - VS  Pt's states no medical or surgical changes since previsit or office visit.

## 2020-09-29 NOTE — Patient Instructions (Signed)
HANDOUTS ON POLYPS & HEMORRHOIDS GIVEN TO YOU TODAY  AWAIT PATHOLOGY RESULTS ON POLYPS REMOVED    YOU HAD AN ENDOSCOPIC PROCEDURE TODAY AT [facility]:   Refer to the procedure report that was given to you for any specific questions about what was found during the examination.  If the procedure report does not answer your questions, please call your gastroenterologist to clarify.  If you requested that your care partner not be given the details of your procedure findings, then the procedure report has been included in a sealed envelope for you to review at your convenience later.  YOU SHOULD EXPECT: Some feelings of bloating in the abdomen. Passage of more gas than usual.  Walking can help get rid of the air that was put into your GI tract during the procedure and reduce the bloating. If you had a lower endoscopy (such as a colonoscopy or flexible sigmoidoscopy) you may notice spotting of blood in your stool or on the toilet paper. If you underwent a bowel prep for your procedure, you may not have a normal bowel movement for a few days.  Please Note:  You might notice some irritation and congestion in your nose or some drainage.  This is from the oxygen used during your procedure.  There is no need for concern and it should clear up in a day or so.  SYMPTOMS TO REPORT IMMEDIATELY:  Following lower endoscopy (colonoscopy or flexible sigmoidoscopy):  Excessive amounts of blood in the stool  Significant tenderness or worsening of abdominal pains  Swelling of the abdomen that is new, acute  Fever of 100F or higher  For urgent or emergent issues, a gastroenterologist can be reached at any hour by calling [phone]. Do not use MyChart messaging for urgent concerns.    DIET:  We do recommend a small meal at first, but then you may proceed to your regular diet.  Drink plenty of fluids but you should avoid alcoholic beverages for 24 hours.  ACTIVITY:  You should plan to  take it easy for the rest of today and you should NOT DRIVE or use heavy machinery until tomorrow (because of the sedation medicines used during the test).    FOLLOW UP: Our staff will call the number listed on your records 48-72 hours following your procedure to check on you and address any questions or concerns that you may have regarding the information given to you following your procedure. If we do not reach you, we will leave a message.  We will attempt to reach you two times.  During this call, we will ask if you have developed any symptoms of COVID 19. If you develop any symptoms (ie: fever, flu-like symptoms, shortness of breath, cough etc.) before then, please call [phone].  If you test positive for Covid 19 in the 2 weeks post procedure, please call and report this information to [place].    If any biopsies were taken you will be contacted by phone or by letter within the next 1-3 weeks.  Please call us at [phone] if you have not heard about the biopsies in 3 weeks.    SIGNATURES/CONFIDENTIALITY: You and/or your care partner have signed paperwork which will be entered into your electronic medical record.  These signatures attest to the fact that that the information above on your After Visit Summary has been reviewed and is understood.  Full responsibility of the confidentiality of this discharge information lies with you and/or your care-partner.

## 2020-09-29 NOTE — Progress Notes (Signed)
936 HR > 100 with esmolol 25 mg given IV, MD updated, vss

## 2020-09-29 NOTE — Progress Notes (Signed)
Called to room to assist during endoscopic procedure.  Patient ID and intended procedure confirmed with present staff. Received instructions for my participation in the procedure from the performing physician.  

## 2020-09-29 NOTE — Op Note (Signed)
[facility] Patient Name: Janet May Procedure Date: 09/29/2020 9:32 AM MRN: [id number] Endoscopist: Ladene Artist , MD Age: 50 Referring MD:  Date of Birth: [birth year] Gender: Female Account #: [id number] Procedure:                Colonoscopy Indications:              Screening for colorectal malignant neoplasm Medicines:                Monitored Anesthesia Care Procedure:                Pre-Anesthesia Assessment:                           - Prior to the procedure, a History and Physical                            was performed, and patient medications and                            allergies were reviewed. The patient's tolerance of                            previous anesthesia was also reviewed. The risks                            and benefits of the procedure and the sedation                            options and risks were discussed with the patient.                            All questions were answered, and informed consent                            was obtained. Prior Anticoagulants: The patient has                            taken no previous anticoagulant or antiplatelet                            agents. ASA Grade Assessment: II - A patient with                            mild systemic disease. After reviewing the risks                            and benefits, the patient was deemed in                            satisfactory condition to undergo the procedure.                           After obtaining informed consent, the colonoscope  was passed under direct vision. Throughout the                            procedure, the patient's blood pressure, pulse, and                            oxygen saturations were monitored continuously. The                            CF [id number] #[id number] was introduced through the anus                            and advanced to the the cecum, identified by                            appendiceal  orifice and ileocecal valve. The                            ileocecal valve, appendiceal orifice, and rectum                            were photographed. The quality of the bowel                            preparation was excellent. The colonoscopy was                            performed without difficulty. The patient tolerated                            the procedure well. Scope In: 9:37:19 AM Scope Out: 9:52:15 AM Scope Withdrawal Time: 0 hours 13 minutes 8 seconds  Total Procedure Duration: 0 hours 14 minutes 56 seconds  Findings:                 The perianal and digital rectal examinations were                            normal.                           Two sessile polyps were found in the distal sigmoid                            colon. The polyps were 6 to 7 mm in size. These                            polyps were removed with a cold snare. Resection                            and retrieval were complete.                           External hemorrhoids were found during  retroflexion. The hemorrhoids were moderate.                           The exam was otherwise without abnormality on                            direct and retroflexion views. Complications:            No immediate complications. Estimated blood loss:                            None. Estimated Blood Loss:     Estimated blood loss: none. Impression:               - Two 6 to 7 mm polyps in the distal sigmoid colon,                            removed with a cold snare. Resected and retrieved.                           - External hemorrhoids.                           - The examination was otherwise normal on direct                            and retroflexion views. Recommendation:           - Repeat colonoscopy after studies are complete for                            surveillance based on pathology results.                           - Patient has a contact number available for                             emergencies. The signs and symptoms of potential                            delayed complications were discussed with the                            patient. Return to normal activities tomorrow.                            Written discharge instructions were provided to the                            patient.                           - Resume previous diet.                           - Continue present medications.                           -  Await pathology results. Ladene Artist, MD 09/29/2020 9:56:39 AM This report has been signed electronically.

## 2020-09-29 NOTE — Progress Notes (Signed)
Report given to PACU, vss 

## 2020-10-10 ENCOUNTER — Encounter: Payer: Self-pay | Admitting: Gastroenterology

## 2020-10-10 ENCOUNTER — Other Ambulatory Visit: Payer: Self-pay | Admitting: Family Medicine

## 2020-10-10 ENCOUNTER — Other Ambulatory Visit (HOSPITAL_BASED_OUTPATIENT_CLINIC_OR_DEPARTMENT_OTHER): Payer: Self-pay

## 2020-10-10 MED ORDER — OMEPRAZOLE 40 MG PO CPDR
DELAYED_RELEASE_CAPSULE | Freq: Every day | ORAL | 0 refills | Status: DC
Start: 2020-10-10 — End: 2021-01-06
  Filled 2020-10-10: qty 90, 90d supply, fill #0

## 2020-11-24 ENCOUNTER — Encounter: Payer: 59 | Admitting: Family Medicine

## 2021-01-06 ENCOUNTER — Other Ambulatory Visit (HOSPITAL_BASED_OUTPATIENT_CLINIC_OR_DEPARTMENT_OTHER): Payer: Self-pay

## 2021-01-06 ENCOUNTER — Other Ambulatory Visit: Payer: Self-pay | Admitting: Family Medicine

## 2021-01-06 MED ORDER — OMEPRAZOLE 40 MG PO CPDR
DELAYED_RELEASE_CAPSULE | Freq: Every day | ORAL | 0 refills | Status: DC
  Filled 2021-01-06: qty 90, 90d supply, fill #0

## 2021-01-06 MED FILL — Fluticasone Propionate Nasal Susp 50 MCG/ACT: NASAL | 30 days supply | Qty: 16 | Fill #2 | Status: AC

## 2021-01-14 ENCOUNTER — Encounter: Payer: 59 | Admitting: Family Medicine

## 2021-02-11 ENCOUNTER — Encounter: Payer: 59 | Admitting: Family Medicine

## 2021-03-11 ENCOUNTER — Telehealth (INDEPENDENT_AMBULATORY_CARE_PROVIDER_SITE_OTHER): Payer: 59 | Admitting: Family Medicine

## 2021-03-11 ENCOUNTER — Encounter: Payer: Self-pay | Admitting: Family Medicine

## 2021-03-11 DIAGNOSIS — U071 COVID-19: Secondary | ICD-10-CM

## 2021-03-11 MED ORDER — PREDNISONE 20 MG PO TABS: 40.0000 mg | ORAL_TABLET | Freq: Every day | ORAL | 0 refills | Status: AC

## 2021-03-11 MED ORDER — PROMETHAZINE-DM 6.25-15 MG/5ML PO SYRP: 5.0000 mL | ORAL_SOLUTION | Freq: Four times a day (QID) | ORAL | 0 refills | Status: DC | PRN

## 2021-03-11 NOTE — Progress Notes (Signed)
Chief Complaint  Patient presents with   Covid Positive    Tested positive 03/10/21    Cough    Headache    Generalized Body Aches    Janet May here for URI complaints. Due to COVID-19 pandemic, we are interacting via web portal for an electronic face-to-face visit. I verified patient's ID using 2 identifiers. Patient agreed to proceed with visit via this method. Patient is at home, I am at office. Patient and I are present for visit.   Duration: 3 days  Associated symptoms: subjective fever, sinus headache, sinus congestion, sinus pain, rhinorrhea, ear pain, sore throat, myalgia, and cough, poor appetite, fatigue Denies: itchy watery eyes, ear drainage, wheezing, shortness of breath, and vomiting, diarrhea Treatment to date: Nyquil, Theraflu, Tylenol Sick contacts: Yes; thinks she got from her son Tested + for covid today, 12/28.  Past Medical History:  Diagnosis Date   Allergy    AVN (avascular necrosis of bone) ([facility])    right hip   GERD (gastroesophageal reflux disease)     Objective No conversational dyspnea Age appropriate judgment and insight Nml affect and mood  COVID-19 - Plan: promethazine-dextromethorphan (PROMETHAZINE-DM) 6.25-15 MG/5ML syrup, predniSONE (DELTASONE) 20 MG tablet  Too young/healthy to benefit from antiviral as she is turning the corner. Has done well w syrup in past, warned about drowsiness. Discussed CDC quarantining recs. 5 d pred burst for likely ETD that is painful.  Continue to push fluids, practice good hand hygiene, cover mouth when coughing. F/u prn. If starting to experience irreplaceable fluid loss, shaking, or shortness of breath, seek immediate care. Pt voiced understanding and agreement to the plan.  [place], DO 03/11/21 10:32 AM

## 2021-04-13 ENCOUNTER — Other Ambulatory Visit: Payer: Self-pay | Admitting: Family Medicine

## 2021-04-13 ENCOUNTER — Other Ambulatory Visit (HOSPITAL_BASED_OUTPATIENT_CLINIC_OR_DEPARTMENT_OTHER): Payer: Self-pay

## 2021-04-13 MED ORDER — OMEPRAZOLE 40 MG PO CPDR
DELAYED_RELEASE_CAPSULE | Freq: Every day | ORAL | 0 refills | Status: DC
  Filled 2021-04-13: qty 90, 90d supply, fill #0

## 2021-04-13 MED FILL — Fluticasone Propionate Nasal Susp 50 MCG/ACT: NASAL | 30 days supply | Qty: 16 | Fill #3 | Status: AC

## 2021-07-06 ENCOUNTER — Other Ambulatory Visit: Payer: Self-pay | Admitting: Family Medicine

## 2021-07-06 ENCOUNTER — Other Ambulatory Visit (HOSPITAL_BASED_OUTPATIENT_CLINIC_OR_DEPARTMENT_OTHER): Payer: Self-pay

## 2021-07-06 MED ORDER — OMEPRAZOLE 40 MG PO CPDR
DELAYED_RELEASE_CAPSULE | Freq: Every day | ORAL | 0 refills | Status: DC
  Filled 2021-07-06: qty 90, 90d supply, fill #0

## 2021-07-06 MED ORDER — FLUTICASONE PROPIONATE 50 MCG/ACT NA SUSP
2.0000 | Freq: Every day | NASAL | 1 refills | Status: DC
Start: 2021-07-06 — End: 2021-12-01
  Filled 2021-07-06: qty 16, 30d supply, fill #0
  Filled 2021-10-02: qty 16, 30d supply, fill #1

## 2021-09-29 DIAGNOSIS — Z124 Encounter for screening for malignant neoplasm of cervix: Secondary | ICD-10-CM | POA: Diagnosis not present

## 2021-09-29 DIAGNOSIS — Z113 Encounter for screening for infections with a predominantly sexual mode of transmission: Secondary | ICD-10-CM | POA: Diagnosis not present

## 2021-09-29 DIAGNOSIS — Z6830 Body mass index (BMI) 30.0-30.9, adult: Secondary | ICD-10-CM | POA: Diagnosis not present

## 2021-09-29 DIAGNOSIS — Z01419 Encounter for gynecological examination (general) (routine) without abnormal findings: Secondary | ICD-10-CM | POA: Diagnosis not present

## 2021-10-02 ENCOUNTER — Other Ambulatory Visit: Payer: Self-pay | Admitting: Family Medicine

## 2021-10-02 ENCOUNTER — Other Ambulatory Visit (HOSPITAL_BASED_OUTPATIENT_CLINIC_OR_DEPARTMENT_OTHER): Payer: Self-pay

## 2021-10-02 MED ORDER — OMEPRAZOLE 40 MG PO CPDR
DELAYED_RELEASE_CAPSULE | Freq: Every day | ORAL | 0 refills | Status: DC
  Filled 2021-10-02: qty 90, 90d supply, fill #0

## 2021-10-06 DIAGNOSIS — Z1231 Encounter for screening mammogram for malignant neoplasm of breast: Secondary | ICD-10-CM | POA: Diagnosis not present

## 2021-12-01 ENCOUNTER — Other Ambulatory Visit: Payer: Self-pay | Admitting: Family Medicine

## 2021-12-01 ENCOUNTER — Other Ambulatory Visit (HOSPITAL_BASED_OUTPATIENT_CLINIC_OR_DEPARTMENT_OTHER): Payer: Self-pay

## 2021-12-01 MED ORDER — FLUTICASONE PROPIONATE 50 MCG/ACT NA SUSP
2.0000 | Freq: Every day | NASAL | 1 refills | Status: DC
Start: 2021-12-01 — End: 2022-09-17
  Filled 2021-12-01: qty 16, 30d supply, fill #0
  Filled 2022-02-17: qty 16, 30d supply, fill #1

## 2021-12-29 ENCOUNTER — Other Ambulatory Visit (HOSPITAL_BASED_OUTPATIENT_CLINIC_OR_DEPARTMENT_OTHER): Payer: Self-pay

## 2021-12-29 ENCOUNTER — Other Ambulatory Visit: Payer: Self-pay | Admitting: Family Medicine

## 2021-12-29 MED ORDER — OMEPRAZOLE 40 MG PO CPDR
40.0000 mg | DELAYED_RELEASE_CAPSULE | Freq: Every day | ORAL | 0 refills | Status: DC
  Filled 2021-12-29: qty 90, 90d supply, fill #0

## 2022-01-12 ENCOUNTER — Other Ambulatory Visit (HOSPITAL_BASED_OUTPATIENT_CLINIC_OR_DEPARTMENT_OTHER): Payer: Self-pay

## 2022-01-12 ENCOUNTER — Encounter: Payer: Self-pay | Admitting: Family Medicine

## 2022-01-12 ENCOUNTER — Other Ambulatory Visit: Payer: Self-pay | Admitting: Family Medicine

## 2022-01-12 ENCOUNTER — Ambulatory Visit (INDEPENDENT_AMBULATORY_CARE_PROVIDER_SITE_OTHER): Payer: 59 | Admitting: Family Medicine

## 2022-01-12 VITALS — BP 121/82 | HR 80 | Temp 98.1°F | Ht 65.0 in | Wt 181.5 lb

## 2022-01-12 DIAGNOSIS — Z0000 Encounter for general adult medical examination without abnormal findings: Secondary | ICD-10-CM | POA: Diagnosis not present

## 2022-01-12 DIAGNOSIS — E785 Hyperlipidemia, unspecified: Secondary | ICD-10-CM

## 2022-01-12 LAB — CBC
HCT: 38.6 % (ref 36.0–46.0)
Hemoglobin: 12.8 g/dL (ref 12.0–15.0)
MCHC: 33.1 g/dL (ref 30.0–36.0)
MCV: 88.1 fl (ref 78.0–100.0)
Platelets: 357 10*3/uL (ref 150.0–400.0)
RBC: 4.38 Mil/uL (ref 3.87–5.11)
RDW: 13.4 % (ref 11.5–15.5)
WBC: 5.1 10*3/uL (ref 4.0–10.5)

## 2022-01-12 LAB — COMPREHENSIVE METABOLIC PANEL
ALT: 20 U/L (ref 0–35)
AST: 25 U/L (ref 0–37)
Albumin: 4.1 g/dL (ref 3.5–5.2)
Alkaline Phosphatase: 64 U/L (ref 39–117)
BUN: 10 mg/dL (ref 6–23)
CO2: 26 mEq/L (ref 19–32)
Calcium: 9 mg/dL (ref 8.4–10.5)
Chloride: 104 mEq/L (ref 96–112)
Creatinine, Ser: 0.8 mg/dL (ref 0.40–1.20)
GFR: 85.27 mL/min (ref >60.00)
Glucose, Bld: 94 mg/dL (ref 70–99)
Potassium: 4 mEq/L (ref 3.5–5.1)
Sodium: 138 mEq/L (ref 135–145)
Total Bilirubin: 0.3 mg/dL (ref 0.2–1.2)
Total Protein: 7.3 g/dL (ref 6.0–8.3)

## 2022-01-12 LAB — LIPID PANEL
Cholesterol: 237 mg/dL — ABNORMAL HIGH (ref 0–200)
HDL: 54.2 mg/dL (ref >39.00)
LDL Cholesterol: 159 mg/dL — ABNORMAL HIGH (ref 0–99)
NonHDL: 183.23
Total CHOL/HDL Ratio: 4
Triglycerides: 122 mg/dL (ref 0.0–149.0)
VLDL: 24.4 mg/dL (ref 0.0–40.0)

## 2022-01-12 MED ORDER — OMEPRAZOLE 40 MG PO CPDR
40.0000 mg | DELAYED_RELEASE_CAPSULE | Freq: Every day | ORAL | 2 refills | Status: DC
  Filled 2022-01-12 – 2022-03-26 (×2): qty 90, 90d supply, fill #0
  Filled 2022-06-25: qty 90, 90d supply, fill #1
  Filled 2022-09-17 (×2): qty 90, 90d supply, fill #2

## 2022-01-12 NOTE — Progress Notes (Signed)
Chief Complaint  Patient presents with   Annual Exam     Well Woman Janet May is here for a complete physical.   Her last physical was >1 year ago.  Current diet: in general, an overall "healthy" diet. Current exercise: cycling, walking. Weight is stable and she denies fatigue out of ordinary. Seatbelt? Yes Advanced directive? No  Health Maintenance Pap/HPV- Yes Mammogram- Yes Colon cancer screening-Yes Shingrix- No Tetanus- Yes Hep C screening- Yes HIV screening- Yes  Past Medical History:  Diagnosis Date   Allergy    AVN (avascular necrosis of bone) ([place])    right hip   GERD (gastroesophageal reflux disease)      Past Surgical History:  Procedure Laterality Date   CESAREAN SECTION  2001   CESAREAN SECTION  2002   TOTAL HIP ARTHROPLASTY Right 05/11/2019   Procedure: RIGHT TOTAL HIP ARTHROPLASTY ANTERIOR APPROACH;  Surgeon: Mcarthur Rossetti, MD;  Location: [facility];  Service: Orthopedics;  Laterality: Right;   TUBAL LIGATION  2005   UPPER GASTROINTESTINAL ENDOSCOPY      Medications  Current Outpatient Medications on File Prior to Visit  Medication Sig Dispense Refill   APPLE CIDER VINEGAR PO Take 2 tablets by mouth daily.     fluticasone (FLONASE) 50 MCG/ACT nasal spray PLACE 2 SPRAYS INTO BOTH NOSTRILS DAILY. 16 g 1   omeprazole (PRILOSEC) 40 MG capsule Take 1 capsule (40 mg total) by mouth daily. 90 capsule 0   Allergies Allergies  Allergen Reactions   Sulfa Antibiotics Hives    Review of Systems: Constitutional:  no unexpected weight changes Eye:  no recent significant change in vision Ear/Nose/Mouth/Throat:  Ears:  no recent change in hearing Nose/Mouth/Throat:  no complaints of nasal congestion, no sore throat Cardiovascular: no chest pain Respiratory:  no shortness of breath Gastrointestinal:  no abdominal pain, no change in bowel habits GU:  Female: negative for dysuria or pelvic pain Musculoskeletal/Extremities:  no pain of the  joints Integumentary (Skin/Breast):  no abnormal skin lesions reported Neurologic:  no headaches Endocrine:  denies fatigue  Exam BP 121/82 (BP Location: Left Arm, Patient Position: Sitting, Cuff Size: Normal)   Pulse 80   Temp 98.1 F (36.7 C) (Oral)   Ht '5\' 5"'$  (1.651 m)   Wt 181 lb 8 oz (82.3 kg)   SpO2 97%   BMI 30.20 kg/m  General:  well developed, well nourished, in no apparent distress Skin:  no significant moles, warts, or growths Head:  no masses, lesions, or tenderness Eyes:  pupils equal and round, sclera anicteric without injection Ears:  canals without lesions, TMs shiny without retraction, no obvious effusion, no erythema Nose:  nares patent, mucosa normal, and no drainage  Throat/Pharynx:  lips and gingiva without lesion; tongue and uvula midline; non-inflamed pharynx; no exudates or postnasal drainage Neck: neck supple without adenopathy, thyromegaly, or masses Lungs:  clear to auscultation, breath sounds equal bilaterally, no respiratory distress Cardio:  regular rate and rhythm, no LE edema Abdomen:  abdomen soft, nontender; bowel sounds normal; no masses or organomegaly Genital: Defer to GYN Musculoskeletal:  symmetrical muscle groups noted without atrophy or deformity Extremities:  no clubbing, cyanosis, or edema, no deformities, no skin discoloration Neuro:  gait normal; deep tendon reflexes normal and symmetric Psych: well oriented with normal range of affect and appropriate judgment/insight  Assessment and Plan  Well adult exam - Plan: CBC, Comprehensive metabolic panel, Lipid panel   Well 51 y.o. female. Counseled on diet and exercise. Advanced  directive form provided today.  Shingrix rec'd.  Other orders as above. Follow up in 1 yr or prn. The patient voiced understanding and agreement to the plan.  [place], DO 01/12/22 10:14 AM

## 2022-01-12 NOTE — Patient Instructions (Addendum)
Give [place] 2-3 business days to get the results of your labs back.   Keep the diet clean and stay active.  The Shingrix vaccine (for shingles) is a 2 shot series spaced 2-6 months apart. It can make people feel low energy, achy and almost like they have the flu for 48 hours after injection. 1/5 people can have nausea and/or vomiting. Please plan accordingly when deciding on when to get this shot. Call our office for a nurse visit appointment to get this. The second shot of the series is less severe regarding the side effects, but it still lasts 48 hours.   Please get me a copy of your advanced directive form at your convenience.   Sleep is important to [place] all. Getting good sleep is imperative to adequate functioning during the day. Work with our counselors who are trained to help people obtain quality sleep. Call [phone] to schedule an appointment or if you are curious about insurance coverage/cost.  Sleep Hygiene Tips: Do not watch TV or look at screens within 1 hour of going to bed. If you do, make sure there is a blue light filter (nighttime mode) involved. Try to go to bed around the same time every night. Wake up at the same time within 1 hour of regular time. Ex: If you wake up at 7 AM for work, do not sleep past 8 AM on days that you don't work. Do not drink alcohol before bedtime. Do not consume caffeine-containing beverages after noon or within 9 hours of intended bedtime. Get regular exercise/physical activity in your life, but not within 2 hours of planned bedtime. Do not take naps.  Do not eat within 2 hours of planned bedtime. Melatonin, 3-5 mg 30-60 minutes before planned bedtime may be helpful.  The bed should be for sleep or sex only. If after 20-30 minutes you are unable to fall asleep, get up and do something relaxing. Do this until you feel ready to go to sleep again.   Let us know if you need anything.

## 2022-02-17 ENCOUNTER — Other Ambulatory Visit (HOSPITAL_BASED_OUTPATIENT_CLINIC_OR_DEPARTMENT_OTHER): Payer: Self-pay

## 2022-02-23 ENCOUNTER — Other Ambulatory Visit (INDEPENDENT_AMBULATORY_CARE_PROVIDER_SITE_OTHER): Payer: 59

## 2022-02-23 DIAGNOSIS — E785 Hyperlipidemia, unspecified: Secondary | ICD-10-CM

## 2022-02-23 LAB — LIPID PANEL
Cholesterol: 228 mg/dL — ABNORMAL HIGH (ref 0–200)
HDL: 50.2 mg/dL (ref >39.00)
LDL Cholesterol: 144 mg/dL — ABNORMAL HIGH (ref 0–99)
NonHDL: 177.7
Total CHOL/HDL Ratio: 5
Triglycerides: 168 mg/dL — ABNORMAL HIGH (ref 0.0–149.0)
VLDL: 33.6 mg/dL (ref 0.0–40.0)

## 2022-03-26 ENCOUNTER — Other Ambulatory Visit (HOSPITAL_BASED_OUTPATIENT_CLINIC_OR_DEPARTMENT_OTHER): Payer: Self-pay

## 2022-06-25 ENCOUNTER — Other Ambulatory Visit (HOSPITAL_BASED_OUTPATIENT_CLINIC_OR_DEPARTMENT_OTHER): Payer: Self-pay

## 2022-09-17 ENCOUNTER — Other Ambulatory Visit: Payer: Self-pay | Admitting: Family Medicine

## 2022-09-17 ENCOUNTER — Other Ambulatory Visit (HOSPITAL_BASED_OUTPATIENT_CLINIC_OR_DEPARTMENT_OTHER): Payer: Self-pay

## 2022-09-17 MED ORDER — FLUTICASONE PROPIONATE 50 MCG/ACT NA SUSP
2.0000 | Freq: Every day | NASAL | 5 refills | Status: DC
  Filled 2022-09-17: qty 16, 30d supply, fill #0
  Filled 2023-04-14: qty 16, 30d supply, fill #1

## 2022-10-08 ENCOUNTER — Encounter: Payer: Self-pay | Admitting: Family Medicine

## 2022-10-12 DIAGNOSIS — Z6831 Body mass index (BMI) 31.0-31.9, adult: Secondary | ICD-10-CM | POA: Diagnosis not present

## 2022-10-12 DIAGNOSIS — Z1231 Encounter for screening mammogram for malignant neoplasm of breast: Secondary | ICD-10-CM | POA: Diagnosis not present

## 2022-10-12 DIAGNOSIS — Z01419 Encounter for gynecological examination (general) (routine) without abnormal findings: Secondary | ICD-10-CM | POA: Diagnosis not present

## 2022-10-27 ENCOUNTER — Ambulatory Visit: Payer: Commercial Managed Care - PPO | Admitting: Family Medicine

## 2022-10-27 ENCOUNTER — Encounter: Payer: Self-pay | Admitting: Family Medicine

## 2022-10-27 VITALS — BP 121/76 | HR 79 | Temp 98.8°F | Ht 65.0 in | Wt 185.5 lb

## 2022-10-27 DIAGNOSIS — E782 Mixed hyperlipidemia: Secondary | ICD-10-CM

## 2022-10-27 DIAGNOSIS — Z711 Person with feared health complaint in whom no diagnosis is made: Secondary | ICD-10-CM

## 2022-10-27 LAB — LIPID PANEL
Cholesterol: 219 mg/dL — ABNORMAL HIGH (ref 0–200)
HDL: 49.7 mg/dL (ref >39.00)
LDL Cholesterol: 141 mg/dL — ABNORMAL HIGH (ref 0–99)
NonHDL: 169.02
Total CHOL/HDL Ratio: 4
Triglycerides: 142 mg/dL (ref 0.0–149.0)
VLDL: 28.4 mg/dL (ref 0.0–40.0)

## 2022-10-27 NOTE — Progress Notes (Signed)
Chief Complaint  Patient presents with   Follow-up    Blood work Note to cancel her massage therapy    Subjective: Patient is a 52 y.o. female here for f/u.  Had elevated cholesterol around 8 mo ago. Diet could be better. Walking 1x/week. No CP or SOB.   Has a membership to a massage place. She is requesting I write a letter stating she is able to cancel it. She has no mood issues or memory deficits. She takes no medications that affect her mentation.   Past Medical History:  Diagnosis Date   Allergy    AVN (avascular necrosis of bone) ([facility])    right hip   GERD (gastroesophageal reflux disease)     Objective: BP 121/76 (BP Location: Left Arm, Patient Position: Sitting, Cuff Size: Large)   Pulse 79   Temp 98.8 F (37.1 C) (Oral)   Ht 5\' 5"  (1.651 m)   Wt 185 lb 8 oz (84.1 kg)   SpO2 99%   BMI 30.87 kg/m  General: Awake, appears stated age Heart: RRR, no LE edema Lungs: CTAB, no rales, wheezes or rhonchi. No accessory muscle use Psych: Age appropriate judgment and insight, normal affect and mood  Assessment and Plan: Mixed hyperlipidemia - Plan: Lipid panel  Physically well but worried  Ck lipid panel. Counseled on diet/exercise. Discussed CACS. She would prefer to avoid a statin. Letter written stating she has mental capacity to make decisions about memberships. F/u in 3 mo for CPE.  The patient voiced understanding and agreement to the plan.  I spent 21 min w the pt discussing the above plans in addition to reviewing her chart on the same day of the visit.   Jilda Roche [place], DO 10/27/22  11:11 AM

## 2022-10-27 NOTE — Patient Instructions (Signed)
Give us 2-3 business days to get the results of your labs back.   Keep the diet clean and stay active.  Aim to do some physical exertion for 150 minutes per week. This is typically divided into 5 days per week, 30 minutes per day. The activity should be enough to get your heart rate up. Anything is better than nothing if you have time constraints.  Let us know if you need anything.  

## 2022-11-03 DIAGNOSIS — L918 Other hypertrophic disorders of the skin: Secondary | ICD-10-CM | POA: Diagnosis not present

## 2022-11-03 DIAGNOSIS — D485 Neoplasm of uncertain behavior of skin: Secondary | ICD-10-CM | POA: Diagnosis not present

## 2022-12-14 ENCOUNTER — Other Ambulatory Visit: Payer: Self-pay | Admitting: Family Medicine

## 2022-12-14 ENCOUNTER — Other Ambulatory Visit (HOSPITAL_BASED_OUTPATIENT_CLINIC_OR_DEPARTMENT_OTHER): Payer: Self-pay

## 2022-12-14 MED ORDER — OMEPRAZOLE 40 MG PO CPDR
40.0000 mg | DELAYED_RELEASE_CAPSULE | Freq: Every day | ORAL | 2 refills | Status: DC
  Filled 2022-12-14: qty 90, 90d supply, fill #0
  Filled 2023-03-18: qty 90, 90d supply, fill #1
  Filled 2023-06-17: qty 90, 90d supply, fill #2

## 2023-01-26 ENCOUNTER — Encounter: Payer: Commercial Managed Care - PPO | Admitting: Family Medicine

## 2023-02-09 ENCOUNTER — Encounter: Payer: Commercial Managed Care - PPO | Admitting: Family Medicine

## 2023-08-05 ENCOUNTER — Other Ambulatory Visit: Payer: Self-pay

## 2023-08-05 ENCOUNTER — Encounter: Payer: Self-pay | Admitting: Family Medicine

## 2023-08-05 ENCOUNTER — Ambulatory Visit: Payer: Self-pay | Admitting: Family Medicine

## 2023-08-05 ENCOUNTER — Ambulatory Visit (INDEPENDENT_AMBULATORY_CARE_PROVIDER_SITE_OTHER): Admitting: Family Medicine

## 2023-08-05 VITALS — BP 120/80 | HR 84 | Temp 97.9°F | Resp 16 | Ht 65.0 in | Wt 189.0 lb

## 2023-08-05 DIAGNOSIS — Z0000 Encounter for general adult medical examination without abnormal findings: Secondary | ICD-10-CM

## 2023-08-05 DIAGNOSIS — E7800 Pure hypercholesterolemia, unspecified: Secondary | ICD-10-CM

## 2023-08-05 LAB — COMPREHENSIVE METABOLIC PANEL WITH GFR
ALT: 21 U/L (ref 0–35)
AST: 21 U/L (ref 0–37)
Albumin: 4.1 g/dL (ref 3.5–5.2)
Alkaline Phosphatase: 58 U/L (ref 39–117)
BUN: 10 mg/dL (ref 6–23)
CO2: 26 meq/L (ref 19–32)
Calcium: 9.1 mg/dL (ref 8.4–10.5)
Chloride: 105 meq/L (ref 96–112)
Creatinine, Ser: 0.84 mg/dL (ref 0.40–1.20)
GFR: 79.54 mL/min (ref >60.00)
Glucose, Bld: 95 mg/dL (ref 70–99)
Potassium: 4.3 meq/L (ref 3.5–5.1)
Sodium: 138 meq/L (ref 135–145)
Total Bilirubin: 0.4 mg/dL (ref 0.2–1.2)
Total Protein: 7.1 g/dL (ref 6.0–8.3)

## 2023-08-05 LAB — LIPID PANEL
Cholesterol: 235 mg/dL — ABNORMAL HIGH (ref 0–200)
HDL: 50.2 mg/dL (ref >39.00)
LDL Cholesterol: 159 mg/dL — ABNORMAL HIGH (ref 0–99)
NonHDL: 185.27
Total CHOL/HDL Ratio: 5
Triglycerides: 130 mg/dL (ref 0.0–149.0)
VLDL: 26 mg/dL (ref 0.0–40.0)

## 2023-08-05 LAB — CBC
HCT: 40 % (ref 36.0–46.0)
Hemoglobin: 13.2 g/dL (ref 12.0–15.0)
MCHC: 33 g/dL (ref 30.0–36.0)
MCV: 87.6 fl (ref 78.0–100.0)
Platelets: 339 10*3/uL (ref 150.0–400.0)
RBC: 4.57 Mil/uL (ref 3.87–5.11)
RDW: 13.3 % (ref 11.5–15.5)
WBC: 4.5 10*3/uL (ref 4.0–10.5)

## 2023-08-05 NOTE — Patient Instructions (Addendum)
 Give us  2-3 business days to get the results of your labs back.   Keep the diet clean and stay active.  Please get me a copy of your advanced directive form at your convenience.   The Shingrix vaccine (for shingles) is a 2 shot series spaced 2-6 months apart. It can make people feel low energy, achy and almost like they have the flu for 48 hours after injection. 1/5 people can have nausea and/or vomiting. Please plan accordingly when deciding on when to get this shot. Call our office for a nurse visit appointment to get this. The second shot of the series is less severe regarding the side effects, but it still lasts 48 hours.   Send me a message in 3-4 weeks if not significantly better with your shoulder and we will proceed accordingly.   Let us  know if you need anything.

## 2023-08-05 NOTE — Progress Notes (Signed)
 Chief Complaint  Patient presents with   Annual Exam    Annual Exam     Well Woman Janet May is here for a complete physical.   Her last physical was >1 year ago.  Current diet: in general, a "healthy" diet. Current exercise: cycling. Weight is increased a little and she confirms fatigue out of ordinary due to some poor sleep. Seatbelt? Yes Advanced directive? No  Health Maintenance Pap/HPV- Yes Mammogram- Yes Colon cancer screening-Yes Shingrix- No Tetanus- Yes Hep C screening- Yes HIV screening- Yes  Past Medical History:  Diagnosis Date   Allergy    AVN (avascular necrosis of bone) ([facility])    right hip   GERD (gastroesophageal reflux disease)      Past Surgical History:  Procedure Laterality Date   CESAREAN SECTION  2001   CESAREAN SECTION  2002   TOTAL HIP ARTHROPLASTY Right 05/11/2019   Procedure: RIGHT TOTAL HIP ARTHROPLASTY ANTERIOR APPROACH;  Surgeon: Arnie Lao, MD;  Location: [facility];  Service: Orthopedics;  Laterality: Right;   TUBAL LIGATION  2005   UPPER GASTROINTESTINAL ENDOSCOPY      Medications  Current Outpatient Medications on File Prior to Visit  Medication Sig Dispense Refill   APPLE CIDER VINEGAR PO Take 2 tablets by mouth daily.     fluticasone  (FLONASE ) 50 MCG/ACT nasal spray Place 2 sprays into both nostrils daily. 16 g 5   omeprazole  (PRILOSEC) 40 MG capsule Take 1 capsule (40 mg total) by mouth daily. 90 capsule 2   Allergies Allergies  Allergen Reactions   Sulfa Antibiotics Hives    Review of Systems: Constitutional:  no unexpected weight changes Eye:  no recent significant change in vision Ear/Nose/Mouth/Throat:  Ears:  no recent change in hearing Nose/Mouth/Throat:  no complaints of nasal congestion, no sore throat Cardiovascular: no chest pain Respiratory:  no shortness of breath Gastrointestinal:  no abdominal pain, no change in bowel habits GU:  Female: negative for dysuria or pelvic  pain Musculoskeletal/Extremities:  no pain of the joints Integumentary (Skin/Breast):  no abnormal skin lesions reported Neurologic:  no headaches Endocrine:  +fatigue  Exam BP 120/80 (BP Location: Left Arm, Patient Position: Sitting)   Pulse 84   Temp 97.9 F (36.6 C) (Oral)   Resp 16   Ht 5\' 5"  (1.651 m)   Wt 189 lb (85.7 kg)   SpO2 95%   BMI 31.45 kg/m  General:  well developed, well nourished, in no apparent distress Skin:  no significant moles, warts, or growths Head:  no masses, lesions, or tenderness Eyes:  pupils equal and round, sclera anicteric without injection Ears:  canals without lesions, TMs shiny without retraction, no obvious effusion, no erythema Nose:  nares patent, mucosa normal, and no drainage  Throat/Pharynx:  lips and gingiva without lesion; tongue and uvula midline; non-inflamed pharynx; no exudates or postnasal drainage Neck: neck supple without adenopathy, thyromegaly, or masses Lungs:  clear to auscultation, breath sounds equal bilaterally, no respiratory distress Cardio:  regular rate and rhythm, no LE edema Abdomen:  abdomen soft, nontender; bowel sounds normal; no masses or organomegaly Genital: Defer to GYN Musculoskeletal:  symmetrical muscle groups noted without atrophy or deformity Hypertonicity noted over both traps +cross over and Cross over Obrien's on L shoulder Extremities:  no clubbing, cyanosis, or edema, no deformities, no skin discoloration Neuro:  gait normal; deep tendon reflexes normal and symmetric Psych: well oriented with normal range of affect and appropriate judgment/insight  Assessment and Plan  Well  adult exam - Plan: CBC, Comprehensive metabolic panel with GFR, Lipid panel   Well 53 y.o. female. Counseled on diet and exercise. Advanced directive form provided today.  Shingrix rec'd.  Stretches for trap and AC jt. Send message if no better in a mo and will consider PT vs injection vs referral.  Other orders as  above. Follow up in 1 yr. The patient voiced understanding and agreement to the plan.  Shellie Dials [place], DO 08/05/23 9:46 AM

## 2023-09-12 ENCOUNTER — Other Ambulatory Visit

## 2023-09-13 ENCOUNTER — Other Ambulatory Visit: Payer: Self-pay | Admitting: Family Medicine

## 2023-09-13 ENCOUNTER — Other Ambulatory Visit (HOSPITAL_BASED_OUTPATIENT_CLINIC_OR_DEPARTMENT_OTHER): Payer: Self-pay

## 2023-09-13 MED ORDER — OMEPRAZOLE 40 MG PO CPDR
40.0000 mg | DELAYED_RELEASE_CAPSULE | Freq: Every day | ORAL | 2 refills | Status: AC
  Filled 2023-09-13: qty 90, 90d supply, fill #0
  Filled 2023-12-13: qty 90, 90d supply, fill #1
  Filled 2024-03-14: qty 90, 90d supply, fill #2

## 2023-09-21 ENCOUNTER — Other Ambulatory Visit (HOSPITAL_BASED_OUTPATIENT_CLINIC_OR_DEPARTMENT_OTHER): Payer: Self-pay

## 2023-09-21 DIAGNOSIS — L811 Chloasma: Secondary | ICD-10-CM | POA: Diagnosis not present

## 2023-09-21 DIAGNOSIS — L819 Disorder of pigmentation, unspecified: Secondary | ICD-10-CM | POA: Diagnosis not present

## 2023-09-21 MED ORDER — HYDROQUINONE 4 % EX CREA
TOPICAL_CREAM | CUTANEOUS | 4 refills | Status: DC
  Filled 2023-09-21: qty 56.7, 60d supply, fill #0

## 2023-10-03 ENCOUNTER — Other Ambulatory Visit: Payer: Self-pay | Admitting: Family Medicine

## 2023-10-03 ENCOUNTER — Other Ambulatory Visit (HOSPITAL_BASED_OUTPATIENT_CLINIC_OR_DEPARTMENT_OTHER): Payer: Self-pay

## 2023-10-03 MED ORDER — FLUTICASONE PROPIONATE 50 MCG/ACT NA SUSP
2.0000 | Freq: Every day | NASAL | 5 refills | Status: AC
  Filled 2023-10-03: qty 16, 30d supply, fill #0

## 2023-10-24 DIAGNOSIS — Z124 Encounter for screening for malignant neoplasm of cervix: Secondary | ICD-10-CM | POA: Diagnosis not present

## 2023-10-24 DIAGNOSIS — Z01419 Encounter for gynecological examination (general) (routine) without abnormal findings: Secondary | ICD-10-CM | POA: Diagnosis not present

## 2023-10-24 DIAGNOSIS — N926 Irregular menstruation, unspecified: Secondary | ICD-10-CM | POA: Diagnosis not present

## 2023-10-24 DIAGNOSIS — Z6831 Body mass index (BMI) 31.0-31.9, adult: Secondary | ICD-10-CM | POA: Diagnosis not present

## 2023-10-24 DIAGNOSIS — Z1231 Encounter for screening mammogram for malignant neoplasm of breast: Secondary | ICD-10-CM | POA: Diagnosis not present

## 2023-11-01 ENCOUNTER — Other Ambulatory Visit (HOSPITAL_BASED_OUTPATIENT_CLINIC_OR_DEPARTMENT_OTHER): Payer: Self-pay

## 2023-11-01 ENCOUNTER — Encounter: Payer: Self-pay | Admitting: Family Medicine

## 2023-11-01 ENCOUNTER — Ambulatory Visit: Payer: Self-pay

## 2023-11-01 ENCOUNTER — Ambulatory Visit: Admitting: Family Medicine

## 2023-11-01 VITALS — BP 112/72 | HR 80 | Temp 97.8°F | Resp 16 | Ht 65.0 in | Wt 184.0 lb

## 2023-11-01 DIAGNOSIS — N3000 Acute cystitis without hematuria: Secondary | ICD-10-CM

## 2023-11-01 LAB — POC URINALSYSI DIPSTICK (AUTOMATED)
Bilirubin, UA: NEGATIVE
Blood, UA: NEGATIVE
Glucose, UA: NEGATIVE
Ketones, UA: NEGATIVE
Leukocytes, UA: NEGATIVE
Nitrite, UA: NEGATIVE
Protein, UA: NEGATIVE
Spec Grav, UA: <=1.005 — AB (ref 1.010–1.025)
Urobilinogen, UA: 0.2 U/dL
pH, UA: 6.5 (ref 5.0–8.0)

## 2023-11-01 MED ORDER — NITROFURANTOIN MONOHYD MACRO 100 MG PO CAPS
100.0000 mg | ORAL_CAPSULE | Freq: Two times a day (BID) | ORAL | 0 refills | Status: AC
  Filled 2023-11-01: qty 10, 5d supply, fill #0

## 2023-11-01 MED ORDER — FLUCONAZOLE 150 MG PO TABS
ORAL_TABLET | ORAL | 0 refills | Status: AC
  Filled 2023-11-01: qty 2, 3d supply, fill #0

## 2023-11-01 NOTE — Progress Notes (Signed)
 Chief Complaint  Patient presents with   Dysuria    Possible UTI    Janet May is a 53 y.o. female here for possible UTI.  Duration: 3 days. Symptoms: Dysuria, urinary frequency, flank pain on right (consistent w previous UTI), and urgency Denies: hematuria, urinary hesitancy, urinary retention, fever, nausea, and vomiting, vaginal discharge Has been taking AZO without obvious improvement.  No hx of pyelo. Hx of recurrent UTI? No Denies new sexual partners of concern.   Past Medical History:  Diagnosis Date   Allergy    AVN (avascular necrosis of bone) ([facility])    right hip   GERD (gastroesophageal reflux disease)      BP 112/72 (BP Location: Left Arm, Patient Position: Sitting)   Pulse 80   Temp 97.8 F (36.6 C) (Oral)   Resp 16   Ht 5' 5 (1.651 m)   Wt 184 lb (83.5 kg)   SpO2 98%   BMI 30.62 kg/m  General: Awake, alert, appears stated age Heart: RRR Lungs: CTAB, normal respiratory effort, no accessory muscle usage Abd: BS+, soft, TTP in suprapubic region, ND, no masses or organomegaly MSK: No CVA tenderness, neg Lloyd's sign Psych: Age appropriate judgment and insight  Acute cystitis without hematuria - Plan: nitrofurantoin , macrocrystal-monohydrate, (MACROBID ) 100 MG capsule  Ck urine. Empirically tx w 5 d of Macrobid . Diflucan  prn. Stay hydrated. Seek immediate care if pt starts to develop fevers, new/worsening symptoms, uncontrollable N/V. F/u prn. The patient voiced understanding and agreement to the plan.  Mabel Mt [place], DO 11/01/23 1:00 PM

## 2023-11-01 NOTE — Addendum Note (Signed)
 Addended by: Dede Dobesh M on: 11/01/2023 01:15 PM   Modules accepted: Orders

## 2023-11-01 NOTE — Patient Instructions (Signed)
 Stay hydrated.   Warning signs/symptoms: Uncontrollable nausea/vomiting, fevers, worsening symptoms despite treatment, confusion.  Give [place] around 2 business days to get culture back to you.  Let us know if you need anything.

## 2023-11-01 NOTE — Telephone Encounter (Signed)
 FYI Only or Action Required?: Action required by provider: request for appointment.  Patient was last seen in primary care on 08/05/2023 by Frann Mabel Mt, DO.  Called Nurse Triage reporting Urinary Tract Infection.  Symptoms began several days ago.  Interventions attempted:  AZO pills.  Symptoms are: unchanged.  Triage Disposition: See Physician Within 24 Hours  Patient/caregiver understands and will follow disposition?: YesCopied from CRM #[id number]. Topic: Clinical - Red Word Triage >> Nov 01, 2023  8:21 AM [facility] wrote: Red Word that prompted transfer to Nurse Triage: UTI >> Nov 01, 2023  8:22 AM [facility] wrote: Patient didn't share specific symptoms  Reason for Disposition  Urinating more frequently than usual (i.e., frequency) OR new-onset of the feeling of an urgent need to urinate (i.e., urgency)  Answer Assessment - Initial Assessment Questions Pt has tried AZO pills.    1. SYMPTOM: What's the main symptom you're concerned about? (e.g., frequency, incontinence)     Burning after urinate 2. ONSET: When did the    start?     Saturday  3. PAIN: Is there any pain? If Yes, ask: How bad is it? (Scale: 1-10; mild, moderate, severe)     2 4. CAUSE: What do you think is causing the symptoms?     Possible UTI 5. OTHER SYMPTOMS: Do you have any other symptoms? (e.g., blood in urine, fever, flank pain, pain with urination)     Back pain  Protocols used: Urinary Symptoms-A-AH

## 2023-11-02 LAB — URINE CULTURE
MICRO NUMBER:: 16852105
Result:: NO GROWTH
SPECIMEN QUALITY:: ADEQUATE

## 2023-11-03 ENCOUNTER — Ambulatory Visit: Payer: Self-pay | Admitting: Family Medicine
